# Patient Record
Sex: Female | Born: 1935 | Race: White | Hispanic: No | Smoking: Never smoker
Health system: Southern US, Community
[De-identification: ages and names within clinical notes are randomized; demographics above are authoritative.]

## PROBLEM LIST (undated history)

## (undated) DIAGNOSIS — E119 Type 2 diabetes mellitus without complications: Secondary | ICD-10-CM

## (undated) DIAGNOSIS — G629 Polyneuropathy, unspecified: Secondary | ICD-10-CM

## (undated) DIAGNOSIS — I10 Essential (primary) hypertension: Secondary | ICD-10-CM

## (undated) DIAGNOSIS — M1990 Unspecified osteoarthritis, unspecified site: Secondary | ICD-10-CM

## (undated) DIAGNOSIS — E7800 Pure hypercholesterolemia, unspecified: Secondary | ICD-10-CM

## (undated) HISTORY — DX: Unspecified osteoarthritis, unspecified site: M19.90

## (undated) HISTORY — DX: Essential (primary) hypertension: I10

## (undated) HISTORY — DX: Type 2 diabetes mellitus without complications: E11.9

## (undated) HISTORY — DX: Pure hypercholesterolemia, unspecified: E78.00

## (undated) HISTORY — DX: Polyneuropathy, unspecified: G62.9

---

## 1976-12-17 HISTORY — PX: CHOLECYSTECTOMY: SHX55

## 2002-05-14 ENCOUNTER — Other Ambulatory Visit: Admission: RE | Admit: 2002-05-14 | Discharge: 2002-05-14 | Payer: Self-pay | Admitting: Family Medicine

## 2003-08-30 ENCOUNTER — Observation Stay (HOSPITAL_COMMUNITY): Admission: EM | Admit: 2003-08-30 | Discharge: 2003-08-31 | Payer: Self-pay | Admitting: Emergency Medicine

## 2003-08-30 ENCOUNTER — Encounter: Payer: Self-pay | Admitting: Cardiovascular Disease

## 2003-09-24 ENCOUNTER — Encounter: Payer: Self-pay | Admitting: Family Medicine

## 2003-09-24 ENCOUNTER — Ambulatory Visit (HOSPITAL_COMMUNITY): Admission: RE | Admit: 2003-09-24 | Discharge: 2003-09-24 | Payer: Self-pay | Admitting: Family Medicine

## 2004-08-30 ENCOUNTER — Encounter: Admission: RE | Admit: 2004-08-30 | Discharge: 2004-10-02 | Payer: Self-pay | Admitting: Family Medicine

## 2004-09-08 ENCOUNTER — Ambulatory Visit (HOSPITAL_COMMUNITY): Admission: RE | Admit: 2004-09-08 | Discharge: 2004-09-08 | Payer: Self-pay | Admitting: Family Medicine

## 2008-09-01 ENCOUNTER — Ambulatory Visit: Payer: Self-pay | Admitting: Vascular Surgery

## 2009-04-18 ENCOUNTER — Encounter: Admission: RE | Admit: 2009-04-18 | Discharge: 2009-04-18 | Payer: Self-pay | Admitting: Obstetrics and Gynecology

## 2011-05-01 NOTE — Procedures (Signed)
DUPLEX DEEP VENOUS EXAM - LOWER EXTREMITY   INDICATION:  Bilateral lower extremity pain and swelling.   HISTORY:  Edema:  Bilateral for several months in the thigh.  Trauma/Surgery:  No.  Pain:  Bilateral.  PE:  No.  Previous DVT:  No.  Anticoagulants:  No.  Other:   DUPLEX EXAM:                CFV   SFV   PopV  PTV    GSV                R  L  R  L  R  L  R   L  R  L  Thrombosis    o  o  o  o  o  o  o   o  o  o  Spontaneous   +  +  +  +  +  +  +   +  +  +  Phasic        +  +  +  +  +  +  +   +  +  +  Augmentation  +  +  +  +  +  +  +   +  +  +  Compressible  +  +  +  +  +  +  +   +  +  +  Competent     +  +  +  +  +  +  +   +  +  +   Legend:  + - yes  o - no  p - partial  D - decreased   IMPRESSION:  No evidence of deep venous thrombosis or superficial venous  thrombosis bilaterally.   Dr. Hyacinth Meeker was notified via phone.    _____________________________  Larina Earthly, M.D.   AS/MEDQ  D:  09/01/2008  T:  09/01/2008  Job:  [id number]

## 2011-05-04 NOTE — H&P (Signed)
NAME:  Yolanda Castaneda, Yolanda Castaneda NO.:  [id number]   MEDICAL RECORD NO.:  [id number]                   PATIENT TYPE:  EMS   LOCATION:  MAJO                                 FACILITY:  [facility]   PHYSICIAN:  Vesta Mixer, M.D.              DATE OF BIRTH:  08/03/1935   DATE OF ADMISSION:  08/30/2003  DATE OF DISCHARGE:                                HISTORY & PHYSICAL   HISTORY OF PRESENT ILLNESS:  Yolanda Castaneda is a 76 year old female who was  admitted to the hospital for episodes of chest pain.   Yolanda Castaneda is a middle-aged female with a history of diabetes mellitus and  hypertension.  She has been in her normal health until yesterday when she  started feeling somewhat sick.  It was hard for her to describe how she was  feeling sick.  She just states that she has not been feeling well.  This  morning around 1 a.m., she developed substernal chest pain.  The chest pain  was described as a tightness and radiated to her right shoulder and right  arm.  She denied any diaphoresis, nausea, vomiting, syncope, or presyncope.  She was seen by EMS and the pain was relieved with sublingual nitroglycerin.  She was brought to the emergency room for further evaluation.   She is currently pain-free.   CURRENT MEDICATIONS:  1. Prandin 4 mg a day.  2. Glucophage two a day.  3. Accupril 20 mg a day.  4. Actos 45 mg a day.   ALLERGIES:  None.   PAST MEDICAL HISTORY:  1. Hypertension.  2. Diabetes mellitus.   SOCIAL HISTORY:  The patient does not smoke and does not drink alcohol.   FAMILY HISTORY:  Noncontributory.   REVIEW OF SYSTEMS:  She denies any heat or cold intolerance, weight gain, or  weight loss.  She denies any problems with her eyes, ears, nose, and throat.  She denies any cough or sputum production.  She denies any fever.  She  denies any hematuria, dysuria, or problems with her GU tract.  She denies  any blood in her stool.  She does have chronic diarrhea.   She denies any  aches, pains, or muscle problems.  She denies any rash or skin nodules.  She  denies any syncope, dizziness, numbness, or chronic pain.   PHYSICAL EXAMINATION:  GENERAL APPEARANCE:  She is a middle-aged female in  no acute distress.  She is alert and oriented x 3.  Her mood and affect are  normal.  VITAL SIGNS:  Her heart rate is 80, blood pressure 113/34, and respiratory  rate 22.  NECK:  2+ carotids.  She has no bruits.  There is no JVD.  No thyromegaly.  LUNGS:  Clear to auscultation.  HEART:  Regular rate.  S1 and S2.  No murmurs, rubs, or gallops.  ABDOMEN:  Good bowel sounds.  Nontender.  EXTREMITIES:  She has no cyanosis, clubbing, or edema.  NEUROLOGIC:  Nonfocal.   LABORATORY DATA:  Her EKG reveals normal sinus rhythm.  She has no ST-T wave  changes.  The EKG is essentially normal.   Yolanda Castaneda presents with an episode of chest pain.  The pain lasted for four  hours and was finally relieved with sublingual nitroglycerin.  Her current  set of enzymes is negative.  The remaining laboratory work is unremarkable.  At this point I would favor admitting her for observation.  If all of her  enzymes are negative, then we can discharge her to home, feeling comfortable  that she is at low risk for having an acute coronary event.  We will do a  stress Cardiolite study on her as an outpatient.  Her other medical problems  are stable.                                                Vesta Mixer, M.D.    PJN/MEDQ  D:  08/30/2003  T:  08/30/2003  Job:  [id number]   cc:   C. Duane Lope, M.D.  [address]  [place]  [place] [address]  Fax: [phone]

## 2011-05-04 NOTE — Discharge Summary (Signed)
   Yolanda Castaneda, Yolanda Castaneda NO.:  [id number]   MEDICAL RECORD NO.:  [id number]                   PATIENT TYPE:  OBV   LOCATION:  3741                                 FACILITY:  [facility]   PHYSICIAN:  Vesta Mixer, M.D.              DATE OF BIRTH:  09/25/1935   DATE OF ADMISSION:  08/30/2003  DATE OF DISCHARGE:  08/31/2003                                 DISCHARGE SUMMARY   DISCHARGE DIAGNOSES:  1. Chest pain, noncardiac.  2. Hypertension.  3. Diabetes mellitus.  4. Mild obesity.   DISCHARGE MEDICATIONS:  1. Accupril 20 mg a day.  2. Prandin 4 mg as directed by Dr. Tenny Craw.  3. Actos 45 mg a day.  4. Glucophage as directed by Dr. Tenny Craw (between 500 mg and 1 g a day).   DISPOSITION:  The patient will see Dr. Elease Hashimoto next week for a stress  Cardiolite study.  She is to call for an appointment.  She is to call if she  has any recurrent episodes of chest pain.   HISTORY OF PRESENT ILLNESS:  Ms. Risenhoover is a 76 year old female with a  history of diabetes mellitus and hypertension.  She was admitted last night  with an episode of chest pain.  Please see the dictated H&P for further  details.   HOSPITAL COURSE:  CHEST PAIN:  The patient ruled out for myocardial  infarction.  She had some chest pain early in the morning after her  admission, but it was clearly musculoskeletal.  It seemed to be worsened by  change of position and got better with Tylenol.  The patient's EKG remained  unchanged and her enzymes remained unchanged.  She will be discharged today  in satisfactory condition.  We will do a stress Cardiolite study as an  outpatient.  All of her other medical problems are stable.                                                Vesta Mixer, M.D.    PJN/MEDQ  D:  08/31/2003  T:  08/31/2003  Job:  [id number]   cc:   C. Duane Lope, M.D.  [address]  [place]  [place] [address]  Fax: [phone]

## 2012-01-03 DIAGNOSIS — E1149 Type 2 diabetes mellitus with other diabetic neurological complication: Secondary | ICD-10-CM | POA: Diagnosis not present

## 2012-01-03 DIAGNOSIS — G479 Sleep disorder, unspecified: Secondary | ICD-10-CM | POA: Diagnosis not present

## 2012-01-03 DIAGNOSIS — L84 Corns and callosities: Secondary | ICD-10-CM | POA: Diagnosis not present

## 2012-01-24 DIAGNOSIS — Q828 Other specified congenital malformations of skin: Secondary | ICD-10-CM | POA: Diagnosis not present

## 2012-01-24 DIAGNOSIS — M216X9 Other acquired deformities of unspecified foot: Secondary | ICD-10-CM | POA: Diagnosis not present

## 2012-01-24 DIAGNOSIS — L84 Corns and callosities: Secondary | ICD-10-CM | POA: Diagnosis not present

## 2012-04-11 DIAGNOSIS — E119 Type 2 diabetes mellitus without complications: Secondary | ICD-10-CM | POA: Diagnosis not present

## 2012-04-29 DIAGNOSIS — R252 Cramp and spasm: Secondary | ICD-10-CM | POA: Diagnosis not present

## 2012-05-02 DIAGNOSIS — M2560 Stiffness of unspecified joint, not elsewhere classified: Secondary | ICD-10-CM | POA: Diagnosis not present

## 2012-05-02 DIAGNOSIS — M62838 Other muscle spasm: Secondary | ICD-10-CM | POA: Diagnosis not present

## 2012-05-02 DIAGNOSIS — M545 Low back pain: Secondary | ICD-10-CM | POA: Diagnosis not present

## 2012-05-05 DIAGNOSIS — M545 Low back pain: Secondary | ICD-10-CM | POA: Diagnosis not present

## 2012-05-05 DIAGNOSIS — M2560 Stiffness of unspecified joint, not elsewhere classified: Secondary | ICD-10-CM | POA: Diagnosis not present

## 2012-05-05 DIAGNOSIS — M62838 Other muscle spasm: Secondary | ICD-10-CM | POA: Diagnosis not present

## 2012-05-07 DIAGNOSIS — M62838 Other muscle spasm: Secondary | ICD-10-CM | POA: Diagnosis not present

## 2012-05-07 DIAGNOSIS — M2560 Stiffness of unspecified joint, not elsewhere classified: Secondary | ICD-10-CM | POA: Diagnosis not present

## 2012-05-07 DIAGNOSIS — M545 Low back pain: Secondary | ICD-10-CM | POA: Diagnosis not present

## 2012-05-08 DIAGNOSIS — H6120 Impacted cerumen, unspecified ear: Secondary | ICD-10-CM | POA: Diagnosis not present

## 2012-05-08 DIAGNOSIS — H9209 Otalgia, unspecified ear: Secondary | ICD-10-CM | POA: Diagnosis not present

## 2012-05-09 DIAGNOSIS — M62838 Other muscle spasm: Secondary | ICD-10-CM | POA: Diagnosis not present

## 2012-05-09 DIAGNOSIS — M2560 Stiffness of unspecified joint, not elsewhere classified: Secondary | ICD-10-CM | POA: Diagnosis not present

## 2012-05-09 DIAGNOSIS — M545 Low back pain: Secondary | ICD-10-CM | POA: Diagnosis not present

## 2012-07-03 DIAGNOSIS — E119 Type 2 diabetes mellitus without complications: Secondary | ICD-10-CM | POA: Diagnosis not present

## 2012-07-03 DIAGNOSIS — M25569 Pain in unspecified knee: Secondary | ICD-10-CM | POA: Diagnosis not present

## 2012-07-03 DIAGNOSIS — E7800 Pure hypercholesterolemia, unspecified: Secondary | ICD-10-CM | POA: Diagnosis not present

## 2012-10-30 DIAGNOSIS — E119 Type 2 diabetes mellitus without complications: Secondary | ICD-10-CM | POA: Diagnosis not present

## 2012-11-05 DIAGNOSIS — Z23 Encounter for immunization: Secondary | ICD-10-CM | POA: Diagnosis not present

## 2012-11-19 DIAGNOSIS — E7800 Pure hypercholesterolemia, unspecified: Secondary | ICD-10-CM | POA: Diagnosis not present

## 2012-11-19 DIAGNOSIS — I10 Essential (primary) hypertension: Secondary | ICD-10-CM | POA: Diagnosis not present

## 2012-11-19 DIAGNOSIS — IMO0001 Reserved for inherently not codable concepts without codable children: Secondary | ICD-10-CM | POA: Diagnosis not present

## 2012-11-19 DIAGNOSIS — K219 Gastro-esophageal reflux disease without esophagitis: Secondary | ICD-10-CM | POA: Diagnosis not present

## 2012-11-25 DIAGNOSIS — T169XXA Foreign body in ear, unspecified ear, initial encounter: Secondary | ICD-10-CM | POA: Diagnosis not present

## 2012-12-02 DIAGNOSIS — T169XXA Foreign body in ear, unspecified ear, initial encounter: Secondary | ICD-10-CM | POA: Diagnosis not present

## 2012-12-19 DIAGNOSIS — N390 Urinary tract infection, site not specified: Secondary | ICD-10-CM | POA: Diagnosis not present

## 2012-12-19 DIAGNOSIS — I10 Essential (primary) hypertension: Secondary | ICD-10-CM | POA: Diagnosis not present

## 2013-02-13 DIAGNOSIS — R05 Cough: Secondary | ICD-10-CM | POA: Diagnosis not present

## 2013-02-13 DIAGNOSIS — R059 Cough, unspecified: Secondary | ICD-10-CM | POA: Diagnosis not present

## 2013-02-26 DIAGNOSIS — M549 Dorsalgia, unspecified: Secondary | ICD-10-CM | POA: Diagnosis not present

## 2013-02-26 DIAGNOSIS — N390 Urinary tract infection, site not specified: Secondary | ICD-10-CM | POA: Diagnosis not present

## 2013-02-26 DIAGNOSIS — IMO0001 Reserved for inherently not codable concepts without codable children: Secondary | ICD-10-CM | POA: Diagnosis not present

## 2013-02-26 DIAGNOSIS — R599 Enlarged lymph nodes, unspecified: Secondary | ICD-10-CM | POA: Diagnosis not present

## 2013-02-26 DIAGNOSIS — E7800 Pure hypercholesterolemia, unspecified: Secondary | ICD-10-CM | POA: Diagnosis not present

## 2013-03-04 ENCOUNTER — Encounter (INDEPENDENT_AMBULATORY_CARE_PROVIDER_SITE_OTHER): Payer: Self-pay | Admitting: General Surgery

## 2013-03-05 ENCOUNTER — Encounter (INDEPENDENT_AMBULATORY_CARE_PROVIDER_SITE_OTHER): Payer: Self-pay | Admitting: General Surgery

## 2013-03-05 ENCOUNTER — Ambulatory Visit (INDEPENDENT_AMBULATORY_CARE_PROVIDER_SITE_OTHER): Payer: Medicare Other | Admitting: General Surgery

## 2013-03-05 ENCOUNTER — Telehealth (INDEPENDENT_AMBULATORY_CARE_PROVIDER_SITE_OTHER): Payer: Self-pay | Admitting: General Surgery

## 2013-03-05 VITALS — BP 118/80 | HR 84 | Resp 18 | Ht 62.0 in | Wt 219.0 lb

## 2013-03-05 DIAGNOSIS — R221 Localized swelling, mass and lump, neck: Secondary | ICD-10-CM

## 2013-03-05 DIAGNOSIS — R220 Localized swelling, mass and lump, head: Secondary | ICD-10-CM

## 2013-03-05 NOTE — Telephone Encounter (Signed)
Spoke with patient she is aware of appt on 03/06/13 at 2:30 pm

## 2013-03-05 NOTE — Patient Instructions (Signed)
I believe the lump is a lipoma.  We will get an ultrasound to confirm diagnosis  Lipoma A lipoma is a noncancerous (benign) tumor composed of fat cells. They are usually found under the skin (subcutaneous). A lipoma may occur in any tissue of the body that contains fat. Common areas for lipomas to appear include the back, shoulders, buttocks, and thighs. Lipomas are a very common soft tissue growth. They are soft and grow slowly. Most problems caused by a lipoma depend on where it is growing. DIAGNOSIS  A lipoma can be diagnosed with a physical exam. These tumors rarely become cancerous, but radiographic studies can help determine this for certain. Studies used may include:  Computerized X-ray scans (CT or CAT scan).  Computerized magnetic scans (MRI). TREATMENT  Small lipomas that are not causing problems may be watched. If a lipoma continues to enlarge or causes problems, removal is often the best treatment. Lipomas can also be removed to improve appearance. Surgery is done to remove the fatty cells and the surrounding capsule. Most often, this is done with medicine that numbs the area (local anesthetic). The removed tissue is examined under a microscope to make sure it is not cancerous. Keep all follow-up appointments with your caregiver. SEEK MEDICAL CARE IF:   The lipoma becomes larger or hard.  The lipoma becomes painful, red, or increasingly swollen. These could be signs of infection or a more serious condition. Document Released: 11/23/2002 Document Revised: 02/25/2012 Document Reviewed: 05/05/2010 [facility] Patient Information 2013 [place], [place].

## 2013-03-05 NOTE — Progress Notes (Signed)
Patient ID: Yolanda Castaneda, female   DOB: Oct 27, 1935, 78 y.o.   MRN: [id number]  Chief Complaint  Patient presents with  . Lymphadenopathy    HPI Yolanda Castaneda is a 78 y.o. female.   HPI 78 yo WF referred by Dr C. Duane Lope for evaluation of neck lymphadenopathy. The patient states that she was found to have a soft tissue mass of her right neck a few years ago and was told it was a probable lipoma. She states that over the past couple years she thinks that it has slowly gotten larger. She denies any skin changes to this area. She denies any trauma to the area. She denies any prior radiation. It is not tender. She states that most recently a second lesion was noticed on the right side of her neck. She states that she's been struggling with short of an upper respiratory tract infection for about a month and a half. She denies any fever, chills, night sweats. She denies any weight loss. She states that her voice is normal. However her daughter states that she thinks her mother's voice is a little but more raspy than normal. She has been intermittently on and off antibiotics for the upper respiratory tract infection. She denies any use of tobacco or alcohol. She also complains of some sensation of food getting stuck. She does not have any pain when she swallows. Past Medical History  Diagnosis Date  . Hypercholesteremia   . Hypertension   . Diabetes mellitus   . OA (osteoarthritis)   . 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 Cholecystectomy  1978  . Cesarean section  1961    Family History  Problem Relation Age of Onset  . Transient ischemic attack Father   . Ovarian cancer Mother   . Prostate cancer Brother     Social History History  Substance Use Topics  . Smoking status: Never Smoker   . Smokeless tobacco: Not on file  . Alcohol Use: No    Allergies  Allergen Reactions  . Atorvastatin     Muscle aches  .  Macrobid (Nitrofurantoin Macrocrystal)   . Niaspan (Niacin Er)   . Victoza (Liraglutide) Hives and Nausea And Vomiting  . Penicillins Rash    Current Outpatient Prescriptions  Medication Sig Dispense Refill  . acetaminophen (TYLENOL) 325 MG tablet Take 650 mg by mouth every 6 (six) hours as needed for pain.      Marland Kitchen albuterol (PROVENTIL) (2.5 MG/3ML) 0.083% nebulizer solution Take 2.5 mg by nebulization every 6 (six) hours as needed for wheezing.      Marland Kitchen aspirin 81 MG tablet Take 81 mg by mouth daily.      . cyclobenzaprine (FLEXERIL) 10 MG tablet Take 10 mg by mouth 3 (three) times daily as needed for muscle spasms.      . Doxylamine Succinate, Sleep, (SLEEP AID PO) Take by mouth as needed.      . Homeopathic Products ([employer] LEG CRAMPS PAIN RELIEF PO) Take by mouth as needed.      . Magnesium 200 MG TABS Take 1 tablet by mouth daily.      . metFORMIN (GLUCOPHAGE) 850 MG tablet Take 850 mg by mouth 2 (two) times daily with a meal.      . Multiple Vitamins-Minerals (CENTRUM SILVER PO) Take 1 tablet by mouth daily.      . naproxen sodium (ANAPROX) 220 MG tablet Take 220  mg by mouth 2 (two) times daily with a meal.      . Omega-3 Fatty Acids (FISH OIL) 1000 MG CAPS Take 1 capsule by mouth daily.      . pioglitazone (ACTOS) 45 MG tablet Take 45 mg by mouth daily.      . Potassium 95 MG TABS Take 1 tablet by mouth daily.      . promethazine-dextromethorphan (PROMETHAZINE-DM) 6.25-15 MG/5ML syrup Take by mouth 4 (four) times daily as needed for cough.      . quinapril (ACCUPRIL) 40 MG tablet Take 40 mg by mouth at bedtime.      . repaglinide (PRANDIN) 2 MG tablet Take 2 mg by mouth 3 (three) times daily before meals.      Marland Kitchen sulfamethoxazole-trimethoprim (BACTRIM DS,SEPTRA DS) 800-160 MG per tablet Take 1 tablet by mouth 2 (two) times daily.       No current facility-administered medications for this visit.    Review of Systems Review of Systems  Constitutional: Negative for fever, chills and  unexpected weight change.  HENT: Positive for hearing loss and trouble swallowing. Negative for congestion.   Eyes: Negative for visual disturbance.  Respiratory: Negative for cough and wheezing.   Cardiovascular: Negative for chest pain, palpitations and leg swelling.  Gastrointestinal: Negative for nausea, vomiting, abdominal pain, diarrhea, constipation, blood in stool, abdominal distention and anal bleeding.  Genitourinary: Negative for hematuria, vaginal bleeding and difficulty urinating.  Musculoskeletal: Negative for arthralgias.       +jt pain  Skin: Negative for rash and wound.  Neurological: Negative for seizures, syncope and headaches.  Hematological: Positive for adenopathy. Does not bruise/bleed easily.  Psychiatric/Behavioral: Negative for confusion.    Blood pressure 118/80, pulse 84, resp. rate 18, height 5\' 2"  (1.575 m), weight 219 lb (99.338 kg).  Physical Exam Physical Exam  Vitals reviewed. Constitutional: She is oriented to person, place, and time. She appears well-developed and well-nourished. No distress.  HENT:  Head: Normocephalic and atraumatic.  Right Ear: External ear normal.  Left Ear: External ear normal.  Eyes: Conjunctivae are normal. No scleral icterus.  Neck: Normal range of motion and phonation normal. Neck supple. No tracheal deviation present. No thyromegaly present.    No thyromegaly. At level of thyroid cartilage on Rt neck about 4cm from midline, soft well circumscribed soft mass. No skin changes. Mobile. Nontender. About 1.5 x 1.5cm; no other palpable neck lesions/masses  Cardiovascular: Normal rate, regular rhythm and normal heart sounds.   Pulmonary/Chest: Effort normal and breath sounds normal. No stridor. No respiratory distress. She has no wheezes.  Abdominal: Soft. There is no tenderness.  Musculoskeletal: She exhibits no edema and no tenderness.  Lymphadenopathy:       Head (right side): No submental, no submandibular, no preauricular  and no posterior auricular adenopathy present.       Head (left side): No submental, no submandibular, no preauricular and no posterior auricular adenopathy present.    She has no cervical adenopathy.       Right: No supraclavicular adenopathy present.       Left: No supraclavicular adenopathy present.  Neurological: She is alert and oriented to person, place, and time.  Skin: Skin is warm and dry. No rash noted. She is not diaphoretic. No erythema. No pallor.  Psychiatric: She has a normal mood and affect. Her behavior is normal. Judgment and thought content normal.    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 The area that I can palpate is most consistent with a lipoma. I did find that she had a prior neck ultrasound from about 4 years ago that showed bilateral thyroid nodules. I explained that a second area could have been a temporarily enlarged lymph node due to her recent Upper respiratory tract infection.  Nonetheless I do think it's worthwhile to get a bilateral neck ultrasound to confirm a diagnosis of a lipoma as well as to make sure that there is no lymphadenopathy that is suspicious. Her and her daughter are in agreement with this plan. This will also allow [place] to reimage her thyroid to see if any of the nodules are large enough that warrants FNA.  Her followup will be based on the results of her neck ultrasound  Mary Sella. Andrey Campanile, MD, FACS General, Bariatric, & Minimally Invasive Surgery [facility], [place]        [facility] M 03/05/2013, 6:06 PM

## 2013-03-06 ENCOUNTER — Ambulatory Visit
Admission: RE | Admit: 2013-03-06 | Discharge: 2013-03-06 | Disposition: A | Discharge status: Home / Self Care | Payer: Medicare Other | Source: Ambulatory Visit | Attending: General Surgery | Admitting: General Surgery

## 2013-03-06 DIAGNOSIS — R221 Localized swelling, mass and lump, neck: Secondary | ICD-10-CM

## 2013-03-06 DIAGNOSIS — D1739 Benign lipomatous neoplasm of skin and subcutaneous tissue of other sites: Secondary | ICD-10-CM | POA: Diagnosis not present

## 2013-03-09 ENCOUNTER — Telehealth (INDEPENDENT_AMBULATORY_CARE_PROVIDER_SITE_OTHER): Payer: Self-pay | Admitting: General Surgery

## 2013-03-09 NOTE — Telephone Encounter (Signed)
Message copied by Liliana Cline on Mon Mar 09, 2013 10:25 AM ------      Message from: Andrey Campanile, ERIC M      Created: Mon Mar 09, 2013  9:42 AM       pls call pt - u/s confirms that the nodule/lump on right side is lipoma. No other abnormalities. Options are leave it alone (99.5% chance benign lipoma) or excise in ambulatory surgery center ------

## 2013-03-09 NOTE — Telephone Encounter (Signed)
Spoke with patient. Made her aware of ultrasound results. Patient states she will think about having surgery and call back, but she is leaning towards no surgery. She will also call with any questions.

## 2013-03-30 DIAGNOSIS — H2510 Age-related nuclear cataract, unspecified eye: Secondary | ICD-10-CM | POA: Diagnosis not present

## 2013-04-17 DIAGNOSIS — M25569 Pain in unspecified knee: Secondary | ICD-10-CM | POA: Diagnosis not present

## 2013-04-17 DIAGNOSIS — M6281 Muscle weakness (generalized): Secondary | ICD-10-CM | POA: Diagnosis not present

## 2013-04-17 DIAGNOSIS — R6889 Other general symptoms and signs: Secondary | ICD-10-CM | POA: Diagnosis not present

## 2013-04-17 DIAGNOSIS — M2560 Stiffness of unspecified joint, not elsewhere classified: Secondary | ICD-10-CM | POA: Diagnosis not present

## 2013-04-20 DIAGNOSIS — M25569 Pain in unspecified knee: Secondary | ICD-10-CM | POA: Diagnosis not present

## 2013-04-20 DIAGNOSIS — M6281 Muscle weakness (generalized): Secondary | ICD-10-CM | POA: Diagnosis not present

## 2013-04-20 DIAGNOSIS — R6889 Other general symptoms and signs: Secondary | ICD-10-CM | POA: Diagnosis not present

## 2013-04-20 DIAGNOSIS — M2560 Stiffness of unspecified joint, not elsewhere classified: Secondary | ICD-10-CM | POA: Diagnosis not present

## 2013-04-22 DIAGNOSIS — R609 Edema, unspecified: Secondary | ICD-10-CM | POA: Diagnosis not present

## 2013-04-22 DIAGNOSIS — M2560 Stiffness of unspecified joint, not elsewhere classified: Secondary | ICD-10-CM | POA: Diagnosis not present

## 2013-04-22 DIAGNOSIS — M25569 Pain in unspecified knee: Secondary | ICD-10-CM | POA: Diagnosis not present

## 2013-04-22 DIAGNOSIS — M6281 Muscle weakness (generalized): Secondary | ICD-10-CM | POA: Diagnosis not present

## 2013-04-22 DIAGNOSIS — I10 Essential (primary) hypertension: Secondary | ICD-10-CM | POA: Diagnosis not present

## 2013-04-22 DIAGNOSIS — IMO0001 Reserved for inherently not codable concepts without codable children: Secondary | ICD-10-CM | POA: Diagnosis not present

## 2013-04-22 DIAGNOSIS — R6889 Other general symptoms and signs: Secondary | ICD-10-CM | POA: Diagnosis not present

## 2013-04-27 DIAGNOSIS — M25569 Pain in unspecified knee: Secondary | ICD-10-CM | POA: Diagnosis not present

## 2013-04-27 DIAGNOSIS — M6281 Muscle weakness (generalized): Secondary | ICD-10-CM | POA: Diagnosis not present

## 2013-04-27 DIAGNOSIS — R6889 Other general symptoms and signs: Secondary | ICD-10-CM | POA: Diagnosis not present

## 2013-04-27 DIAGNOSIS — M2560 Stiffness of unspecified joint, not elsewhere classified: Secondary | ICD-10-CM | POA: Diagnosis not present

## 2013-04-29 DIAGNOSIS — IMO0001 Reserved for inherently not codable concepts without codable children: Secondary | ICD-10-CM | POA: Diagnosis not present

## 2013-04-29 DIAGNOSIS — R609 Edema, unspecified: Secondary | ICD-10-CM | POA: Diagnosis not present

## 2013-05-01 DIAGNOSIS — M25569 Pain in unspecified knee: Secondary | ICD-10-CM | POA: Diagnosis not present

## 2013-05-01 DIAGNOSIS — M6281 Muscle weakness (generalized): Secondary | ICD-10-CM | POA: Diagnosis not present

## 2013-05-01 DIAGNOSIS — M2560 Stiffness of unspecified joint, not elsewhere classified: Secondary | ICD-10-CM | POA: Diagnosis not present

## 2013-05-01 DIAGNOSIS — R6889 Other general symptoms and signs: Secondary | ICD-10-CM | POA: Diagnosis not present

## 2013-05-04 DIAGNOSIS — M2560 Stiffness of unspecified joint, not elsewhere classified: Secondary | ICD-10-CM | POA: Diagnosis not present

## 2013-05-04 DIAGNOSIS — M6281 Muscle weakness (generalized): Secondary | ICD-10-CM | POA: Diagnosis not present

## 2013-05-04 DIAGNOSIS — R6889 Other general symptoms and signs: Secondary | ICD-10-CM | POA: Diagnosis not present

## 2013-05-04 DIAGNOSIS — M25569 Pain in unspecified knee: Secondary | ICD-10-CM | POA: Diagnosis not present

## 2013-05-07 DIAGNOSIS — R609 Edema, unspecified: Secondary | ICD-10-CM | POA: Diagnosis not present

## 2013-05-07 DIAGNOSIS — M25569 Pain in unspecified knee: Secondary | ICD-10-CM | POA: Diagnosis not present

## 2013-05-07 DIAGNOSIS — M6281 Muscle weakness (generalized): Secondary | ICD-10-CM | POA: Diagnosis not present

## 2013-05-07 DIAGNOSIS — M2560 Stiffness of unspecified joint, not elsewhere classified: Secondary | ICD-10-CM | POA: Diagnosis not present

## 2013-05-07 DIAGNOSIS — R252 Cramp and spasm: Secondary | ICD-10-CM | POA: Diagnosis not present

## 2013-05-07 DIAGNOSIS — R6889 Other general symptoms and signs: Secondary | ICD-10-CM | POA: Diagnosis not present

## 2013-05-15 DIAGNOSIS — R030 Elevated blood-pressure reading, without diagnosis of hypertension: Secondary | ICD-10-CM | POA: Diagnosis not present

## 2013-05-15 DIAGNOSIS — R609 Edema, unspecified: Secondary | ICD-10-CM | POA: Diagnosis not present

## 2013-05-18 DIAGNOSIS — M5137 Other intervertebral disc degeneration, lumbosacral region: Secondary | ICD-10-CM | POA: Diagnosis not present

## 2013-05-18 DIAGNOSIS — M999 Biomechanical lesion, unspecified: Secondary | ICD-10-CM | POA: Diagnosis not present

## 2013-05-20 DIAGNOSIS — M5137 Other intervertebral disc degeneration, lumbosacral region: Secondary | ICD-10-CM | POA: Diagnosis not present

## 2013-05-20 DIAGNOSIS — M999 Biomechanical lesion, unspecified: Secondary | ICD-10-CM | POA: Diagnosis not present

## 2013-05-22 DIAGNOSIS — R5381 Other malaise: Secondary | ICD-10-CM | POA: Diagnosis not present

## 2013-05-22 DIAGNOSIS — M999 Biomechanical lesion, unspecified: Secondary | ICD-10-CM | POA: Diagnosis not present

## 2013-05-22 DIAGNOSIS — M5137 Other intervertebral disc degeneration, lumbosacral region: Secondary | ICD-10-CM | POA: Diagnosis not present

## 2013-05-22 DIAGNOSIS — G479 Sleep disorder, unspecified: Secondary | ICD-10-CM | POA: Diagnosis not present

## 2013-05-22 DIAGNOSIS — R5383 Other fatigue: Secondary | ICD-10-CM | POA: Diagnosis not present

## 2013-05-22 DIAGNOSIS — I959 Hypotension, unspecified: Secondary | ICD-10-CM | POA: Diagnosis not present

## 2013-05-25 DIAGNOSIS — M5137 Other intervertebral disc degeneration, lumbosacral region: Secondary | ICD-10-CM | POA: Diagnosis not present

## 2013-05-25 DIAGNOSIS — M999 Biomechanical lesion, unspecified: Secondary | ICD-10-CM | POA: Diagnosis not present

## 2013-05-27 DIAGNOSIS — M999 Biomechanical lesion, unspecified: Secondary | ICD-10-CM | POA: Diagnosis not present

## 2013-05-27 DIAGNOSIS — M5137 Other intervertebral disc degeneration, lumbosacral region: Secondary | ICD-10-CM | POA: Diagnosis not present

## 2013-05-29 DIAGNOSIS — M999 Biomechanical lesion, unspecified: Secondary | ICD-10-CM | POA: Diagnosis not present

## 2013-05-29 DIAGNOSIS — M5137 Other intervertebral disc degeneration, lumbosacral region: Secondary | ICD-10-CM | POA: Diagnosis not present

## 2013-06-10 DIAGNOSIS — R3915 Urgency of urination: Secondary | ICD-10-CM | POA: Diagnosis not present

## 2013-06-10 DIAGNOSIS — E7800 Pure hypercholesterolemia, unspecified: Secondary | ICD-10-CM | POA: Diagnosis not present

## 2013-06-10 DIAGNOSIS — K219 Gastro-esophageal reflux disease without esophagitis: Secondary | ICD-10-CM | POA: Diagnosis not present

## 2013-06-10 DIAGNOSIS — E039 Hypothyroidism, unspecified: Secondary | ICD-10-CM | POA: Diagnosis not present

## 2013-06-10 DIAGNOSIS — E1142 Type 2 diabetes mellitus with diabetic polyneuropathy: Secondary | ICD-10-CM | POA: Diagnosis not present

## 2013-06-10 DIAGNOSIS — Z23 Encounter for immunization: Secondary | ICD-10-CM | POA: Diagnosis not present

## 2013-06-10 DIAGNOSIS — Z0000 Encounter for general adult medical examination without abnormal findings: Secondary | ICD-10-CM | POA: Diagnosis not present

## 2013-06-10 DIAGNOSIS — E1149 Type 2 diabetes mellitus with other diabetic neurological complication: Secondary | ICD-10-CM | POA: Diagnosis not present

## 2013-06-10 DIAGNOSIS — I10 Essential (primary) hypertension: Secondary | ICD-10-CM | POA: Diagnosis not present

## 2013-06-12 DIAGNOSIS — K219 Gastro-esophageal reflux disease without esophagitis: Secondary | ICD-10-CM | POA: Diagnosis not present

## 2013-06-12 DIAGNOSIS — E7800 Pure hypercholesterolemia, unspecified: Secondary | ICD-10-CM | POA: Diagnosis not present

## 2013-06-12 DIAGNOSIS — R3915 Urgency of urination: Secondary | ICD-10-CM | POA: Diagnosis not present

## 2013-06-12 DIAGNOSIS — E1142 Type 2 diabetes mellitus with diabetic polyneuropathy: Secondary | ICD-10-CM | POA: Diagnosis not present

## 2013-06-12 DIAGNOSIS — I10 Essential (primary) hypertension: Secondary | ICD-10-CM | POA: Diagnosis not present

## 2013-06-12 DIAGNOSIS — E039 Hypothyroidism, unspecified: Secondary | ICD-10-CM | POA: Diagnosis not present

## 2013-06-12 DIAGNOSIS — E1149 Type 2 diabetes mellitus with other diabetic neurological complication: Secondary | ICD-10-CM | POA: Diagnosis not present

## 2013-06-12 DIAGNOSIS — Z0000 Encounter for general adult medical examination without abnormal findings: Secondary | ICD-10-CM | POA: Diagnosis not present

## 2013-07-02 DIAGNOSIS — F411 Generalized anxiety disorder: Secondary | ICD-10-CM | POA: Diagnosis not present

## 2013-07-02 DIAGNOSIS — J40 Bronchitis, not specified as acute or chronic: Secondary | ICD-10-CM | POA: Diagnosis not present

## 2013-07-02 DIAGNOSIS — H3320 Serous retinal detachment, unspecified eye: Secondary | ICD-10-CM | POA: Diagnosis not present

## 2013-07-02 DIAGNOSIS — Z48812 Encounter for surgical aftercare following surgery on the circulatory system: Secondary | ICD-10-CM | POA: Diagnosis not present

## 2013-08-21 DIAGNOSIS — H8110 Benign paroxysmal vertigo, unspecified ear: Secondary | ICD-10-CM | POA: Diagnosis not present

## 2013-08-31 ENCOUNTER — Ambulatory Visit: Payer: Medicare Other | Admitting: Rehabilitative and Restorative Service Providers"

## 2013-09-01 ENCOUNTER — Ambulatory Visit: Payer: Medicare Other | Attending: Family Medicine | Admitting: Physical Therapy

## 2013-09-01 DIAGNOSIS — R42 Dizziness and giddiness: Secondary | ICD-10-CM | POA: Insufficient documentation

## 2013-09-01 DIAGNOSIS — IMO0001 Reserved for inherently not codable concepts without codable children: Secondary | ICD-10-CM | POA: Insufficient documentation

## 2013-09-01 DIAGNOSIS — R269 Unspecified abnormalities of gait and mobility: Secondary | ICD-10-CM | POA: Diagnosis not present

## 2013-09-10 DIAGNOSIS — H2510 Age-related nuclear cataract, unspecified eye: Secondary | ICD-10-CM | POA: Diagnosis not present

## 2013-09-10 DIAGNOSIS — E119 Type 2 diabetes mellitus without complications: Secondary | ICD-10-CM | POA: Diagnosis not present

## 2013-09-30 DIAGNOSIS — E119 Type 2 diabetes mellitus without complications: Secondary | ICD-10-CM | POA: Diagnosis not present

## 2013-09-30 DIAGNOSIS — R4789 Other speech disturbances: Secondary | ICD-10-CM | POA: Diagnosis not present

## 2013-09-30 DIAGNOSIS — I10 Essential (primary) hypertension: Secondary | ICD-10-CM | POA: Diagnosis not present

## 2013-09-30 DIAGNOSIS — H539 Unspecified visual disturbance: Secondary | ICD-10-CM | POA: Diagnosis not present

## 2013-09-30 DIAGNOSIS — E7800 Pure hypercholesterolemia, unspecified: Secondary | ICD-10-CM | POA: Diagnosis not present

## 2013-10-01 ENCOUNTER — Other Ambulatory Visit: Payer: Self-pay | Admitting: Family Medicine

## 2013-10-01 DIAGNOSIS — R479 Unspecified speech disturbances: Secondary | ICD-10-CM

## 2013-10-09 ENCOUNTER — Ambulatory Visit
Admission: RE | Admit: 2013-10-09 | Discharge: 2013-10-09 | Disposition: A | Discharge status: Home / Self Care | Payer: Medicare Other | Source: Ambulatory Visit | Attending: Family Medicine | Admitting: Family Medicine

## 2013-10-09 DIAGNOSIS — I6529 Occlusion and stenosis of unspecified carotid artery: Secondary | ICD-10-CM | POA: Diagnosis not present

## 2013-10-09 DIAGNOSIS — R479 Unspecified speech disturbances: Secondary | ICD-10-CM

## 2013-10-09 DIAGNOSIS — D320 Benign neoplasm of cerebral meninges: Secondary | ICD-10-CM | POA: Diagnosis not present

## 2013-10-15 ENCOUNTER — Other Ambulatory Visit: Payer: Self-pay

## 2013-10-15 ENCOUNTER — Other Ambulatory Visit (HOSPITAL_COMMUNITY): Payer: Self-pay | Admitting: Family Medicine

## 2013-10-15 ENCOUNTER — Ambulatory Visit (HOSPITAL_COMMUNITY): Payer: Medicare Other | Attending: Family Medicine | Admitting: Radiology

## 2013-10-15 DIAGNOSIS — G459 Transient cerebral ischemic attack, unspecified: Secondary | ICD-10-CM

## 2013-10-15 DIAGNOSIS — I69928 Other speech and language deficits following unspecified cerebrovascular disease: Secondary | ICD-10-CM | POA: Diagnosis not present

## 2013-10-15 DIAGNOSIS — R479 Unspecified speech disturbances: Secondary | ICD-10-CM

## 2013-10-15 NOTE — Progress Notes (Signed)
Echocardiogram performed.  

## 2013-10-20 ENCOUNTER — Encounter (INDEPENDENT_AMBULATORY_CARE_PROVIDER_SITE_OTHER): Payer: Self-pay

## 2013-10-20 ENCOUNTER — Ambulatory Visit (INDEPENDENT_AMBULATORY_CARE_PROVIDER_SITE_OTHER): Payer: Medicare Other | Admitting: Neurology

## 2013-10-20 ENCOUNTER — Encounter: Payer: Self-pay | Admitting: Neurology

## 2013-10-20 VITALS — BP 143/60 | HR 70 | Ht 61.0 in | Wt 246.0 lb

## 2013-10-20 DIAGNOSIS — R4182 Altered mental status, unspecified: Secondary | ICD-10-CM

## 2013-10-20 DIAGNOSIS — D320 Benign neoplasm of cerebral meninges: Secondary | ICD-10-CM | POA: Diagnosis not present

## 2013-10-20 DIAGNOSIS — D329 Benign neoplasm of meninges, unspecified: Secondary | ICD-10-CM | POA: Insufficient documentation

## 2013-10-20 NOTE — Patient Instructions (Signed)
Overall you are doing fairly well but I do want to suggest a few things today:   Remember to drink plenty of fluid, eat healthy meals and do not skip any meals. Try to eat protein with a every meal and eat a healthy snack such as fruit or nuts in between meals. Try to keep a regular sleep-wake schedule and try to exercise daily, particularly in the form of walking, 20-30 minutes a day, if you can.   Your symptoms are concerning for a possible Transient Ischemic attack (min-stroke) or a possible seizure due to the meningioma. In most cases the meningiomas are benign and just need to be monitored.   As far as your medications are concerned, I would like to suggest continuing on the aspirin 325mg  daily and Pravachol.   As far as diagnostic testing:  1)EEG 2)We will repeat your brain MRI in 1 year  I would like to see you back in 12 months, sooner if we need to. Please call us with any interim questions, concerns, problems, updates or refill requests.   Please also call us for any test results so we can go over those with you on the phone.  My clinical assistant and will answer any of your questions and relay your messages to me and also relay most of my messages to you.   Our phone number is [phone]. We also have an after hours call service for urgent matters and there is a physician on-call for urgent questions. For any emergencies you know to call 911 or go to the nearest emergency room

## 2013-10-20 NOTE — Progress Notes (Signed)
[facility]    Provider:  Dr Hosie Poisson Referring Provider: Duane Lope, MD Primary Care Physician:   Duane Lope, MD\  CC: abnormal brain mri  HPI:  Yolanda Castaneda is a 78 y.o. female here as a referral from Dr. Tenny Craw for abnormal brain MRI  Had 2 brief episodes of word finding difficulties, she was aware of it/her surroundings the whole time, no automatisms during the event, patient reports it was awhile since she had ate, raised concern that her sugards were low but did not eat anything and it went away on its own. No noted light headed sensation, both episodes of <30s. For years have seen geometric patterns of flashing lights, comes and goes, lasts around 1 minute and then resolves. In July had brief episode of vertigo lasting <5 minutes. No history of prior strokes or mini strokes. No migraine headaches. No seizure history.   Had stroke workup done by PCP including unremarkable carotid ultrasound, MRI brain.   Reviewed notes, labs and imaging from outside physician, pertinent for prior the brain showing chronic small vessel disease, possible old infarct and a  7 mm meningioma in the left paracentral region.   Review of Systems: Out of a complete 14 system review, the patient complains of only the following symptoms, and all other reviewed systems are negative. Positive for chills weight gain eye pain easy bruising restart numbness tremor wheezing swelling in legs hair loss ringing in ears cramps allergies to much sleep  History   Social History  . Marital Status: Widowed    Spouse Name: N/A    Number of Children: 2  . Years of Education: BS   Occupational History  . Not on file.   Social History Main Topics  . Smoking status: Never Smoker   . Smokeless tobacco: Never Used  . Alcohol Use: No  . Drug Use: No  . Sexual Activity: Not on file   Other Topics Concern  . Not on file   Social History Narrative   Patient lives at home alone.    Patient is retired.     Patient is widowed.    Patient has 2 daughters.    Patient has her BS    Family History  Problem Relation Age of Onset  . Transient ischemic attack Father   . Ovarian cancer Mother   . Prostate cancer Brother     Past Medical History  Diagnosis Date  . Hypercholesteremia   . Hypertension   . Diabetes mellitus   . OA (osteoarthritis)   . 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 Cholecystectomy  1978  . Cesarean section  1961    Current Outpatient Prescriptions  Medication Sig Dispense Refill  . acetaminophen (TYLENOL) 325 MG tablet Take 650 mg by mouth every 6 (six) hours as needed for pain.      Marland Kitchen albuterol (PROVENTIL) (2.5 MG/3ML) 0.083% nebulizer solution Take 2.5 mg by nebulization every 6 (six) hours as needed for wheezing.      Marland Kitchen aspirin 81 MG tablet Take 81 mg by mouth daily.      . cyclobenzaprine (FLEXERIL) 10 MG tablet Take 10 mg by mouth 3 (three) times daily as needed for muscle spasms.      . Doxylamine Succinate, Sleep, (SLEEP AID PO) Take by mouth as needed.      . Homeopathic Products ([employer] LEG CRAMPS PAIN RELIEF PO) Take by mouth as  needed.      . Magnesium 200 MG TABS Take 1 tablet by mouth daily.      . Multiple Vitamins-Minerals (CENTRUM SILVER PO) Take 1 tablet by mouth daily.      . naproxen sodium (ANAPROX) 220 MG tablet Take 220 mg by mouth 2 (two) times daily with a meal.      . Omega-3 Fatty Acids (FISH OIL) 1000 MG CAPS Take 1 capsule by mouth daily.      . pioglitazone (ACTOS) 45 MG tablet Take 45 mg by mouth daily.      . Potassium 95 MG TABS Take 1 tablet by mouth daily.      . pravastatin (PRAVACHOL) 80 MG tablet 80 mg.      . promethazine-dextromethorphan (PROMETHAZINE-DM) 6.25-15 MG/5ML syrup Take by mouth 4 (four) times daily as needed for cough.      . QUEtiapine (SEROQUEL) 50 MG tablet       . quinapril (ACCUPRIL) 40 MG tablet Take 40 mg by mouth at bedtime.      .  repaglinide (PRANDIN) 2 MG tablet Take 2 mg by mouth 3 (three) times daily before meals.      Marland Kitchen sulfamethoxazole-trimethoprim (BACTRIM DS,SEPTRA DS) 800-160 MG per tablet Take 1 tablet by mouth 2 (two) times daily.      Marland Kitchen SYNTHROID 50 MCG tablet        No current facility-administered medications for this visit.    Allergies as of 10/20/2013 - Review Complete 10/20/2013  Allergen Reaction Noted  . Atorvastatin  03/04/2013  . Macrobid [nitrofurantoin macrocrystal]  03/04/2013  . Niaspan [niacin er]  03/04/2013  . Victoza [liraglutide] Hives and Nausea And Vomiting 03/04/2013  . Penicillins Rash 03/04/2013    Vitals: BP 143/60  Pulse 70  Ht 5\' 1"  (1.549 m)  Wt 246 lb (111.585 kg)  BMI 46.51 kg/m2 Last Weight:  Wt Readings from Last 1 Encounters:  10/20/13 246 lb (111.585 kg)   Last Height:   Ht Readings from Last 1 Encounters:  10/20/13 5\' 1"  (1.549 m)     Physical exam: Exam: Gen: NAD, conversant Eyes: anicteric sclerae, moist conjunctivae HENT: Atraumatic, oropharynx clear Neck: Trachea midline; supple,  Lungs: CTA, no wheezing, rales, rhonic                          CV: RRR, no MRG Abdomen: Soft, non-tender;  Extremities: No peripheral edema  Skin: Normal temperature, no rash,  Psych: Appropriate affect, pleasant  Neuro: MS: AA&Ox3, appropriately interactive, normal affect   Speech: fluent w/o paraphasic error  Memory: good recent and remote recall  CN: PERRL, EOMI no nystagmus, no ptosis, sensation intact to LT V1-V3 bilat, face symmetric, no weakness, hearing grossly intact, palate elevates symmetrically, shoulder shrug 5/5 bilat,  tongue protrudes midline, no fasiculations noted.  Motor: normal bulk and tone Strength: 5/5  In all extremities  Coord: rapid alternating and point-to-point (FNF, HTS) movements intact.  Reflexes: symmetrical, bilat downgoing toes  Sens: LT intact in all extremities  Gait: posture, stance, stride and arm-swing normal.  Tandem gait intact. Able to walk on heels and toes. Romberg absent.   Assessment:  After physical and neurologic examination, review of laboratory studies, imaging, neurophysiology testing and pre-existing records, assessment will be reviewed on the problem list.  Plan:  Treatment plan and additional workup will be reviewed under Problem List.  1)Word finding difficulties: r/o TIA vs seizure 2)Meningioma  28 showed woman presenting for  initial evaluation of transient episode of word finding difficulty. Physical exam is unremarkable, MRI of the brain shows chronic small vessel disease and I small left frontal 7 mm meningioma. Patient has multiple risk factors for stroke including hypertension hyperlipidemia and diabetes. The differential of these episodes would include transient ischemic attack versus possible seizure secondary to meningioma. Patient to continue on aspirin 325 mg and Pravachol. Followup with primary care physician for optimization of medical risk factors. Will check brain EEG. Will repeat MRI of the brain in one year. If any change in symptoms would consider neurosurgical evaluation of meningioma. Followup in 12 months.

## 2013-10-21 ENCOUNTER — Ambulatory Visit (INDEPENDENT_AMBULATORY_CARE_PROVIDER_SITE_OTHER): Payer: Medicare Other | Admitting: Radiology

## 2013-10-21 DIAGNOSIS — R4182 Altered mental status, unspecified: Secondary | ICD-10-CM | POA: Diagnosis not present

## 2013-11-01 ENCOUNTER — Other Ambulatory Visit: Payer: Self-pay | Admitting: Hematology and Oncology

## 2013-11-03 NOTE — Procedures (Signed)
    History:  Yolanda Castaneda is a 78 year old patient with a history of 2 events of word finding difficulties unassociated with loss of consciousness. The patient has a history of visual migraine-type events. The patient is being evaluated for the episodes of word finding problems.  This is a routine EEG. No skull defects are noted. Medications include albuterol, aspirin, Flexeril, magnesium supplementation, multivitamins, fish oil, pravastatin, meclizine, Seroquel, Accupril, Prandin, Synthroid, and Bactrim.   EEG classification: Normal awake and drowsy  Description of the recording: The background rhythms of this recording consists of a fairly well modulated medium amplitude alpha rhythm of 9 Hz that is reactive to eye opening and closure. As the record progresses, the patient appears to remain in the waking state throughout the recording. Photic stimulation was performed, resulting in a bilateral and symmetric photic driving response. Hyperventilation was not performed. Toward the end of the recording, the patient enters the drowsy state with slight symmetric slowing seen. The patient never enters stage II sleep. At no time during the recording does there appear to be evidence of spike or spike wave discharges or evidence of focal slowing. EKG monitor shows no evidence of cardiac rhythm abnormalities with a heart rate of 72.  Impression: This is a normal EEG recording in the waking and drowsy state. No evidence of ictal or interictal discharges are seen.

## 2013-11-04 DIAGNOSIS — E039 Hypothyroidism, unspecified: Secondary | ICD-10-CM | POA: Diagnosis not present

## 2013-11-04 DIAGNOSIS — G479 Sleep disorder, unspecified: Secondary | ICD-10-CM | POA: Diagnosis not present

## 2013-11-04 DIAGNOSIS — R609 Edema, unspecified: Secondary | ICD-10-CM | POA: Diagnosis not present

## 2013-11-04 DIAGNOSIS — R635 Abnormal weight gain: Secondary | ICD-10-CM | POA: Diagnosis not present

## 2013-11-04 DIAGNOSIS — E119 Type 2 diabetes mellitus without complications: Secondary | ICD-10-CM | POA: Diagnosis not present

## 2013-11-25 DIAGNOSIS — E039 Hypothyroidism, unspecified: Secondary | ICD-10-CM | POA: Diagnosis not present

## 2013-11-25 DIAGNOSIS — E7800 Pure hypercholesterolemia, unspecified: Secondary | ICD-10-CM | POA: Diagnosis not present

## 2013-11-25 DIAGNOSIS — G479 Sleep disorder, unspecified: Secondary | ICD-10-CM | POA: Diagnosis not present

## 2013-11-25 DIAGNOSIS — I10 Essential (primary) hypertension: Secondary | ICD-10-CM | POA: Diagnosis not present

## 2013-11-25 DIAGNOSIS — E119 Type 2 diabetes mellitus without complications: Secondary | ICD-10-CM | POA: Diagnosis not present

## 2013-11-25 DIAGNOSIS — Z23 Encounter for immunization: Secondary | ICD-10-CM | POA: Diagnosis not present

## 2013-12-02 ENCOUNTER — Telehealth: Payer: Self-pay | Admitting: Interventional Cardiology

## 2013-12-02 NOTE — Telephone Encounter (Signed)
New problem   Need to know when and if Dr Katrinka Blazing recommend pt to have another Echocardiogram. Please Francee Piccolo

## 2013-12-03 NOTE — Telephone Encounter (Signed)
Only if cardiac symptoms.

## 2013-12-03 NOTE — Telephone Encounter (Signed)
Only if cardiac symptoms. 

## 2013-12-07 NOTE — Telephone Encounter (Signed)
lmom.called home and cell...pt does not need repeat echo if she is not having cardiac symptoms.

## 2013-12-22 DIAGNOSIS — H00029 Hordeolum internum unspecified eye, unspecified eyelid: Secondary | ICD-10-CM | POA: Diagnosis not present

## 2013-12-22 DIAGNOSIS — E109 Type 1 diabetes mellitus without complications: Secondary | ICD-10-CM | POA: Diagnosis not present

## 2013-12-22 DIAGNOSIS — H25019 Cortical age-related cataract, unspecified eye: Secondary | ICD-10-CM | POA: Diagnosis not present

## 2013-12-22 DIAGNOSIS — H353190 Nonexudative age-related macular degeneration, unspecified eye, stage unspecified: Secondary | ICD-10-CM | POA: Diagnosis not present

## 2013-12-22 DIAGNOSIS — H40059 Ocular hypertension, unspecified eye: Secondary | ICD-10-CM | POA: Diagnosis not present

## 2013-12-28 DIAGNOSIS — Z91040 Latex allergy status: Secondary | ICD-10-CM | POA: Diagnosis not present

## 2013-12-31 DIAGNOSIS — H2510 Age-related nuclear cataract, unspecified eye: Secondary | ICD-10-CM | POA: Diagnosis not present

## 2013-12-31 DIAGNOSIS — Z01818 Encounter for other preprocedural examination: Secondary | ICD-10-CM | POA: Diagnosis not present

## 2014-01-04 DIAGNOSIS — H2510 Age-related nuclear cataract, unspecified eye: Secondary | ICD-10-CM | POA: Diagnosis not present

## 2014-01-06 DIAGNOSIS — H2510 Age-related nuclear cataract, unspecified eye: Secondary | ICD-10-CM | POA: Diagnosis not present

## 2014-01-07 DIAGNOSIS — H2510 Age-related nuclear cataract, unspecified eye: Secondary | ICD-10-CM | POA: Diagnosis not present

## 2014-01-21 DIAGNOSIS — H2510 Age-related nuclear cataract, unspecified eye: Secondary | ICD-10-CM | POA: Diagnosis not present

## 2014-02-09 DIAGNOSIS — H918X9 Other specified hearing loss, unspecified ear: Secondary | ICD-10-CM | POA: Diagnosis not present

## 2014-02-15 DIAGNOSIS — M25569 Pain in unspecified knee: Secondary | ICD-10-CM | POA: Diagnosis not present

## 2014-02-15 DIAGNOSIS — M6281 Muscle weakness (generalized): Secondary | ICD-10-CM | POA: Diagnosis not present

## 2014-02-15 DIAGNOSIS — M2560 Stiffness of unspecified joint, not elsewhere classified: Secondary | ICD-10-CM | POA: Diagnosis not present

## 2014-02-15 DIAGNOSIS — R6889 Other general symptoms and signs: Secondary | ICD-10-CM | POA: Diagnosis not present

## 2014-02-17 DIAGNOSIS — M2560 Stiffness of unspecified joint, not elsewhere classified: Secondary | ICD-10-CM | POA: Diagnosis not present

## 2014-02-17 DIAGNOSIS — M25569 Pain in unspecified knee: Secondary | ICD-10-CM | POA: Diagnosis not present

## 2014-02-17 DIAGNOSIS — R6889 Other general symptoms and signs: Secondary | ICD-10-CM | POA: Diagnosis not present

## 2014-02-17 DIAGNOSIS — M6281 Muscle weakness (generalized): Secondary | ICD-10-CM | POA: Diagnosis not present

## 2014-02-19 DIAGNOSIS — M6281 Muscle weakness (generalized): Secondary | ICD-10-CM | POA: Diagnosis not present

## 2014-02-19 DIAGNOSIS — R6889 Other general symptoms and signs: Secondary | ICD-10-CM | POA: Diagnosis not present

## 2014-02-19 DIAGNOSIS — M25569 Pain in unspecified knee: Secondary | ICD-10-CM | POA: Diagnosis not present

## 2014-02-19 DIAGNOSIS — M2560 Stiffness of unspecified joint, not elsewhere classified: Secondary | ICD-10-CM | POA: Diagnosis not present

## 2014-03-10 DIAGNOSIS — M25569 Pain in unspecified knee: Secondary | ICD-10-CM | POA: Diagnosis not present

## 2014-03-16 DIAGNOSIS — M25569 Pain in unspecified knee: Secondary | ICD-10-CM | POA: Diagnosis not present

## 2014-03-24 DIAGNOSIS — M25569 Pain in unspecified knee: Secondary | ICD-10-CM | POA: Diagnosis not present

## 2014-05-17 DIAGNOSIS — E119 Type 2 diabetes mellitus without complications: Secondary | ICD-10-CM | POA: Diagnosis not present

## 2014-05-17 DIAGNOSIS — H2640 Unspecified secondary cataract: Secondary | ICD-10-CM | POA: Diagnosis not present

## 2014-05-17 DIAGNOSIS — H04209 Unspecified epiphora, unspecified lacrimal gland: Secondary | ICD-10-CM | POA: Diagnosis not present

## 2014-05-17 DIAGNOSIS — H02839 Dermatochalasis of unspecified eye, unspecified eyelid: Secondary | ICD-10-CM | POA: Diagnosis not present

## 2014-05-17 DIAGNOSIS — H02409 Unspecified ptosis of unspecified eyelid: Secondary | ICD-10-CM | POA: Diagnosis not present

## 2014-05-17 DIAGNOSIS — Z961 Presence of intraocular lens: Secondary | ICD-10-CM | POA: Diagnosis not present

## 2014-05-22 IMAGING — US US SOFT TISSUE HEAD/NECK
1 series · 4 of 4 positions shown · non-contrast
Comparison: None.

CLINICAL DATA: Soft tissue mass in the anterior superior aspect of
the right side of the neck.

ULTRASOUND OF HEAD/NECK SOFT TISSUES
TECHNIQUE: Ultrasound examination of the head and neck soft
tissues was performed in the area of clinical concern.

[Series 1: us soft tissue head/neck · 0.06mm/px · 4 of 4 slices shown]
[im 1/4]
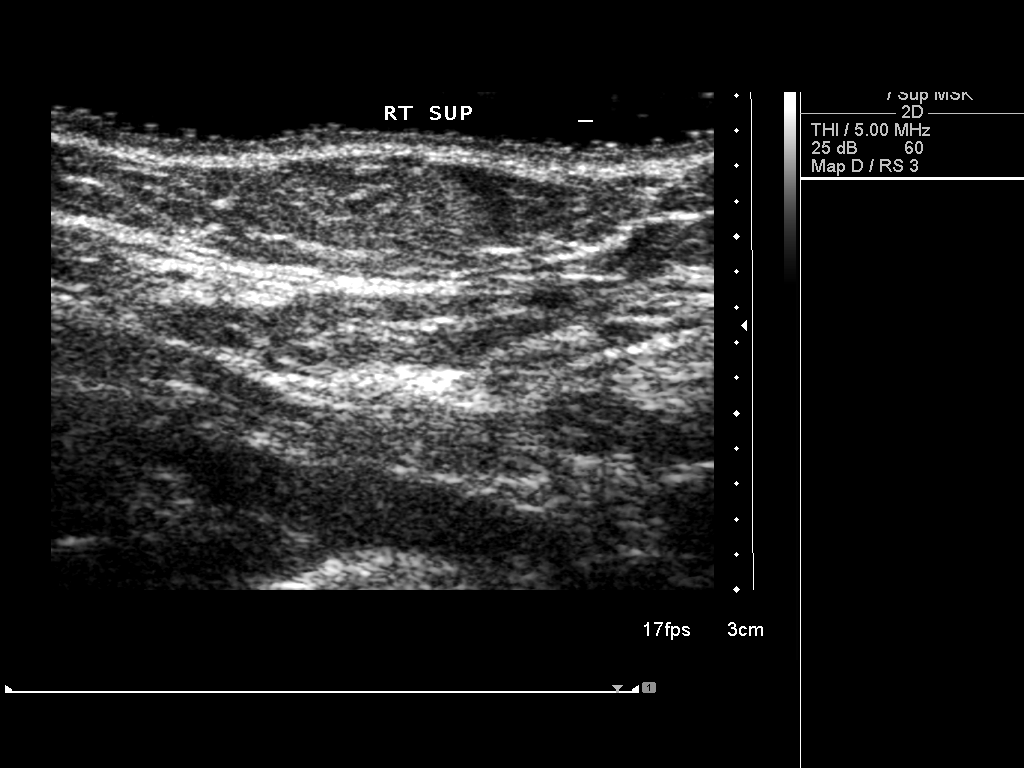
[im 2/4]
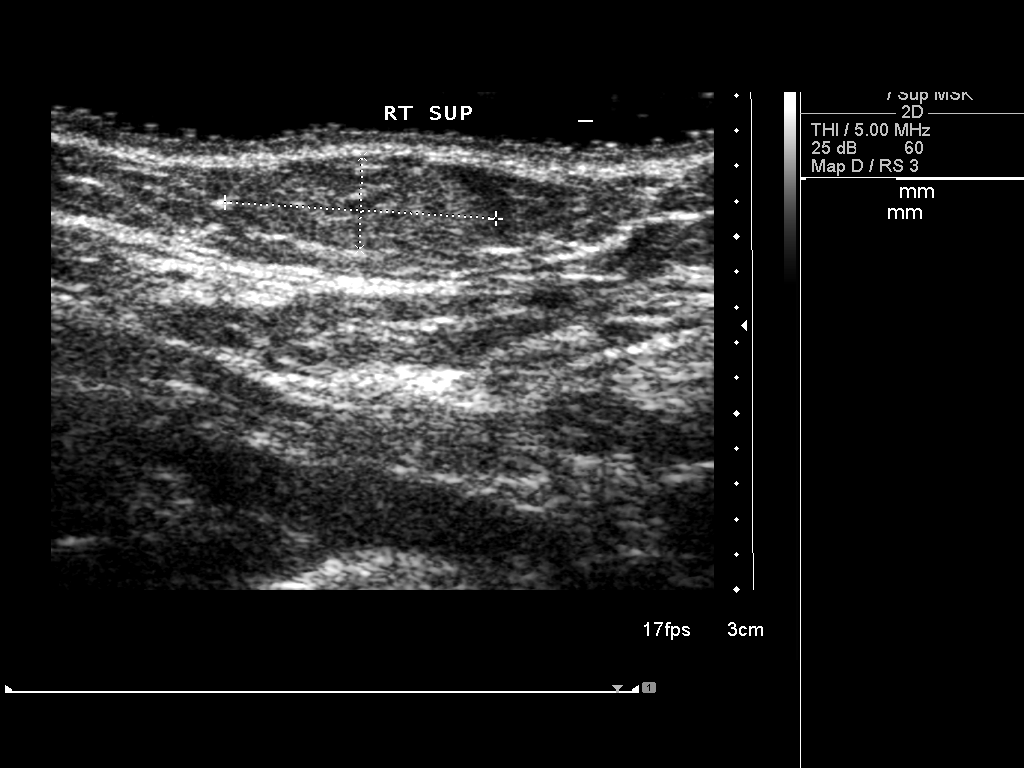
[im 3/4]
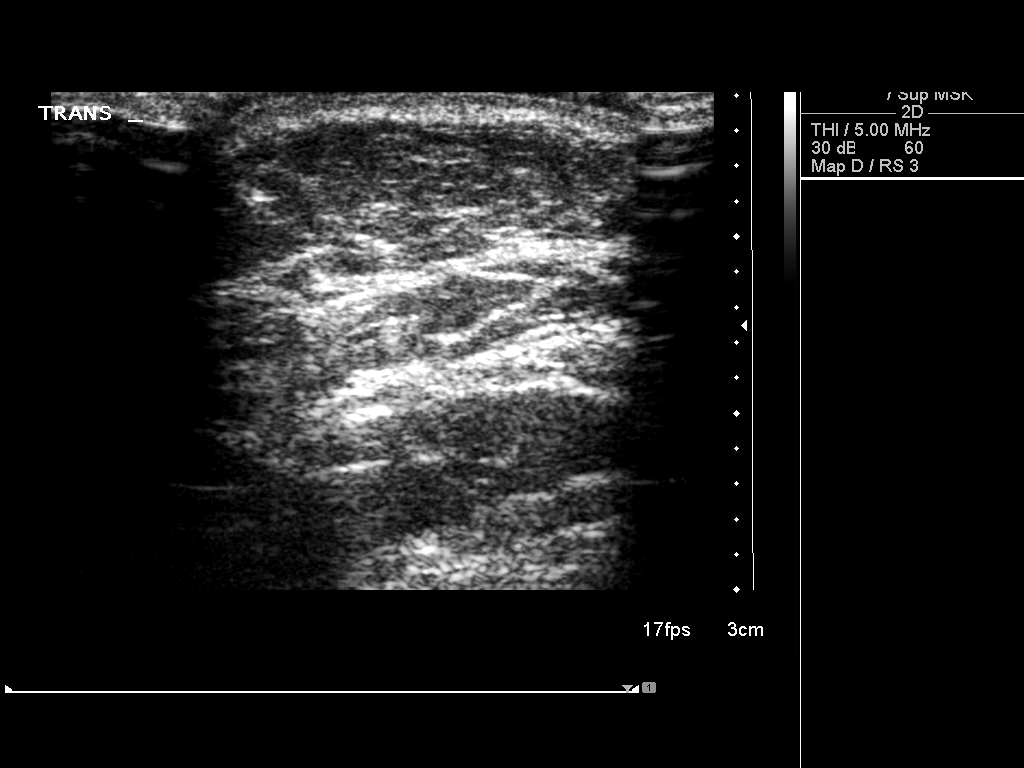
[im 4/4]
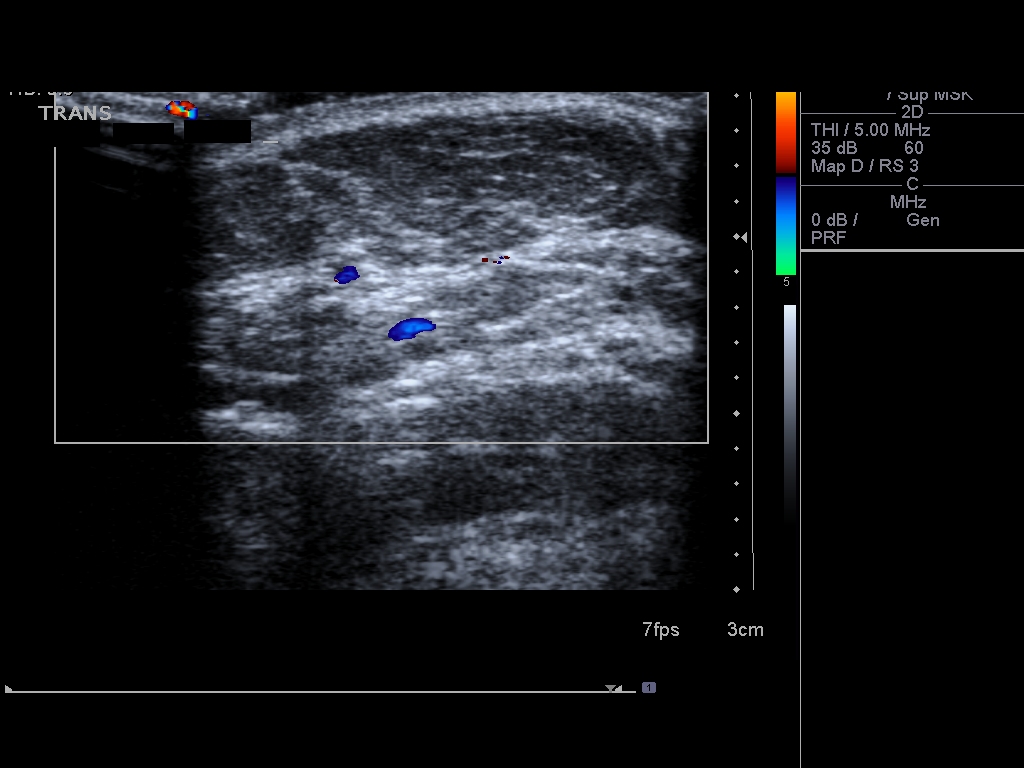

[4 of 4 positions shown; findings below may reference images not displayed]

FINDINGS: Real time ultrasound was performed using a 13.5 MHz
transducer.  The visible and palpable abnormality is demonstrated
to be a small benign-appearing subcutaneous lipoma measuring 15.4 x
5.3 mm.  No other abnormalities.
IMPRESSION: Benign small subcutaneous lipoma.

## 2014-06-15 DIAGNOSIS — R079 Chest pain, unspecified: Secondary | ICD-10-CM | POA: Diagnosis not present

## 2014-06-15 DIAGNOSIS — Z0000 Encounter for general adult medical examination without abnormal findings: Secondary | ICD-10-CM | POA: Diagnosis not present

## 2014-06-15 DIAGNOSIS — E7800 Pure hypercholesterolemia, unspecified: Secondary | ICD-10-CM | POA: Diagnosis not present

## 2014-06-15 DIAGNOSIS — E119 Type 2 diabetes mellitus without complications: Secondary | ICD-10-CM | POA: Diagnosis not present

## 2014-06-15 DIAGNOSIS — E039 Hypothyroidism, unspecified: Secondary | ICD-10-CM | POA: Diagnosis not present

## 2014-06-15 DIAGNOSIS — G479 Sleep disorder, unspecified: Secondary | ICD-10-CM | POA: Diagnosis not present

## 2014-06-15 DIAGNOSIS — I10 Essential (primary) hypertension: Secondary | ICD-10-CM | POA: Diagnosis not present

## 2014-07-09 DIAGNOSIS — S83289A Other tear of lateral meniscus, current injury, unspecified knee, initial encounter: Secondary | ICD-10-CM | POA: Diagnosis not present

## 2014-07-09 DIAGNOSIS — M25569 Pain in unspecified knee: Secondary | ICD-10-CM | POA: Diagnosis not present

## 2014-07-19 DIAGNOSIS — M7120 Synovial cyst of popliteal space [Baker], unspecified knee: Secondary | ICD-10-CM | POA: Diagnosis not present

## 2014-07-19 DIAGNOSIS — S83289A Other tear of lateral meniscus, current injury, unspecified knee, initial encounter: Secondary | ICD-10-CM | POA: Diagnosis not present

## 2014-07-19 DIAGNOSIS — M25569 Pain in unspecified knee: Secondary | ICD-10-CM | POA: Diagnosis not present

## 2014-07-28 DIAGNOSIS — M5137 Other intervertebral disc degeneration, lumbosacral region: Secondary | ICD-10-CM | POA: Diagnosis not present

## 2014-07-28 DIAGNOSIS — M999 Biomechanical lesion, unspecified: Secondary | ICD-10-CM | POA: Diagnosis not present

## 2014-07-28 DIAGNOSIS — M9981 Other biomechanical lesions of cervical region: Secondary | ICD-10-CM | POA: Diagnosis not present

## 2014-08-03 DIAGNOSIS — M9981 Other biomechanical lesions of cervical region: Secondary | ICD-10-CM | POA: Diagnosis not present

## 2014-08-03 DIAGNOSIS — M5137 Other intervertebral disc degeneration, lumbosacral region: Secondary | ICD-10-CM | POA: Diagnosis not present

## 2014-08-03 DIAGNOSIS — M999 Biomechanical lesion, unspecified: Secondary | ICD-10-CM | POA: Diagnosis not present

## 2014-08-05 DIAGNOSIS — M25569 Pain in unspecified knee: Secondary | ICD-10-CM | POA: Diagnosis not present

## 2014-08-09 DIAGNOSIS — M25569 Pain in unspecified knee: Secondary | ICD-10-CM | POA: Diagnosis not present

## 2014-08-10 DIAGNOSIS — Z961 Presence of intraocular lens: Secondary | ICD-10-CM | POA: Diagnosis not present

## 2014-08-10 DIAGNOSIS — H04209 Unspecified epiphora, unspecified lacrimal gland: Secondary | ICD-10-CM | POA: Diagnosis not present

## 2014-08-13 DIAGNOSIS — M9981 Other biomechanical lesions of cervical region: Secondary | ICD-10-CM | POA: Diagnosis not present

## 2014-08-13 DIAGNOSIS — M5137 Other intervertebral disc degeneration, lumbosacral region: Secondary | ICD-10-CM | POA: Diagnosis not present

## 2014-08-13 DIAGNOSIS — M999 Biomechanical lesion, unspecified: Secondary | ICD-10-CM | POA: Diagnosis not present

## 2014-08-25 DIAGNOSIS — M23302 Other meniscus derangements, unspecified lateral meniscus, unspecified knee: Secondary | ICD-10-CM | POA: Diagnosis not present

## 2014-08-25 DIAGNOSIS — M23305 Other meniscus derangements, unspecified medial meniscus, unspecified knee: Secondary | ICD-10-CM | POA: Diagnosis not present

## 2014-08-25 DIAGNOSIS — M9420 Chondromalacia, unspecified site: Secondary | ICD-10-CM | POA: Diagnosis not present

## 2014-08-25 DIAGNOSIS — G8918 Other acute postprocedural pain: Secondary | ICD-10-CM | POA: Diagnosis not present

## 2014-08-25 DIAGNOSIS — S83289A Other tear of lateral meniscus, current injury, unspecified knee, initial encounter: Secondary | ICD-10-CM | POA: Diagnosis not present

## 2014-08-25 DIAGNOSIS — IMO0002 Reserved for concepts with insufficient information to code with codable children: Secondary | ICD-10-CM | POA: Diagnosis not present

## 2014-08-25 DIAGNOSIS — M2240 Chondromalacia patellae, unspecified knee: Secondary | ICD-10-CM | POA: Diagnosis not present

## 2014-08-31 DIAGNOSIS — I10 Essential (primary) hypertension: Secondary | ICD-10-CM | POA: Diagnosis not present

## 2014-08-31 DIAGNOSIS — R42 Dizziness and giddiness: Secondary | ICD-10-CM | POA: Diagnosis not present

## 2014-08-31 DIAGNOSIS — E7800 Pure hypercholesterolemia, unspecified: Secondary | ICD-10-CM | POA: Diagnosis not present

## 2014-08-31 DIAGNOSIS — K219 Gastro-esophageal reflux disease without esophagitis: Secondary | ICD-10-CM | POA: Diagnosis not present

## 2014-08-31 DIAGNOSIS — E1149 Type 2 diabetes mellitus with other diabetic neurological complication: Secondary | ICD-10-CM | POA: Diagnosis not present

## 2014-08-31 DIAGNOSIS — E039 Hypothyroidism, unspecified: Secondary | ICD-10-CM | POA: Diagnosis not present

## 2014-08-31 DIAGNOSIS — M25569 Pain in unspecified knee: Secondary | ICD-10-CM | POA: Diagnosis not present

## 2014-08-31 DIAGNOSIS — M5430 Sciatica, unspecified side: Secondary | ICD-10-CM | POA: Diagnosis not present

## 2014-09-07 DIAGNOSIS — M5137 Other intervertebral disc degeneration, lumbosacral region: Secondary | ICD-10-CM | POA: Diagnosis not present

## 2014-09-07 DIAGNOSIS — M999 Biomechanical lesion, unspecified: Secondary | ICD-10-CM | POA: Diagnosis not present

## 2014-09-07 DIAGNOSIS — M9981 Other biomechanical lesions of cervical region: Secondary | ICD-10-CM | POA: Diagnosis not present

## 2014-09-16 DIAGNOSIS — H8110 Benign paroxysmal vertigo, unspecified ear: Secondary | ICD-10-CM | POA: Diagnosis not present

## 2014-09-17 DIAGNOSIS — M5136 Other intervertebral disc degeneration, lumbar region: Secondary | ICD-10-CM | POA: Diagnosis not present

## 2014-09-17 DIAGNOSIS — M9903 Segmental and somatic dysfunction of lumbar region: Secondary | ICD-10-CM | POA: Diagnosis not present

## 2014-09-17 DIAGNOSIS — M5137 Other intervertebral disc degeneration, lumbosacral region: Secondary | ICD-10-CM | POA: Diagnosis not present

## 2014-09-17 DIAGNOSIS — M9901 Segmental and somatic dysfunction of cervical region: Secondary | ICD-10-CM | POA: Diagnosis not present

## 2014-09-17 DIAGNOSIS — M9902 Segmental and somatic dysfunction of thoracic region: Secondary | ICD-10-CM | POA: Diagnosis not present

## 2014-10-20 ENCOUNTER — Encounter: Payer: Self-pay | Admitting: Neurology

## 2014-10-20 ENCOUNTER — Ambulatory Visit (INDEPENDENT_AMBULATORY_CARE_PROVIDER_SITE_OTHER): Payer: Medicare Other | Admitting: Neurology

## 2014-10-20 VITALS — BP 144/59 | HR 89 | Temp 98.3°F | Ht 61.0 in | Wt 232.0 lb

## 2014-10-20 DIAGNOSIS — H8110 Benign paroxysmal vertigo, unspecified ear: Secondary | ICD-10-CM

## 2014-10-20 DIAGNOSIS — R42 Dizziness and giddiness: Secondary | ICD-10-CM | POA: Diagnosis not present

## 2014-10-20 DIAGNOSIS — G609 Hereditary and idiopathic neuropathy, unspecified: Secondary | ICD-10-CM | POA: Diagnosis not present

## 2014-10-20 DIAGNOSIS — D329 Benign neoplasm of meninges, unspecified: Secondary | ICD-10-CM | POA: Diagnosis not present

## 2014-10-20 DIAGNOSIS — G3184 Mild cognitive impairment, so stated: Secondary | ICD-10-CM

## 2014-10-20 NOTE — Progress Notes (Signed)
[facility]    Provider: Dr Antony Haste Referring Provider: Melinda Crutch, MD Primary Care Physician:  Melinda Crutch, MD\  CC: abnormal brain mri and word-finding difficulties  HPI: Yolanda Castaneda is a 79 y.o. female here as a follow-up for abnormal brain MRI and word-finding difficulty. Last seen by Dr. Janann Colonel and is transferring care.  Had 2 brief episodes of word finding difficulties over a year ago, she was aware of it/her surroundings the whole time, no automatisms during the event, patient reports it was awhile since she had eaten, raised concern that her sugars were low but did not eat anything and it went away on its own. Just couldn't get the words out, lasted 20 seconds. She reports no similar events in the last year. Workup at that time revealed a meningioma. She denies headaches but she coughed the othe day and felt pressure in the top of her head. No vision changes, no speech changes, no other focal neurologic symptoms.  She had vertigo 3 weeks ago and saw an ENT, vomited with room spinning. She did the Epley Maneuver which resolved the situation. No vertigo since.   Feels she has some cognitive changes, can't remember people's names from church and has known them for years but she will eventually remember, her concentration is not as good, no depression or anxiety, no hallucinations or delusions, living independently and paying own bills without problems. She takes seroquel at night and melatonin for sleep.  Denies snoring or apneic events.  For years have seen geometric patterns of flashing lights, comes and goes, lasts around 1 minute and then resolves. Has been going on 25 years. Happens once a week. No migraine headaches. No seizure history.   Had stroke workup done by PCP including unremarkable carotid ultrasound, MRI brain.   Reviewed notes, labs and personally reviewed imaging from outside physician, pertinent for prior the brain showing chronic small vessel  disease, possible old infarct and a 7 mm meningioma in the left paracentral region.   Review of Systems: Out of a complete 14 system review, the patient complains of only the following symptoms, and all other reviewed systems are negative. Positive for weight gain, memory loss, daytime sleepiness, walking difficulty due to knee surgery.   History   Social History  . Marital Status: Widowed    Spouse Name: N/A    Number of Children: 2  . Years of Education: BS   Occupational History  . Not on file.   Social History Main Topics  . Smoking status: Never Smoker   . Smokeless tobacco: Never Used  . Alcohol Use: No  . Drug Use: No  . Sexual Activity: Not on file   Other Topics Concern  . Not on file   Social History Narrative   Patient lives at home alone.    Patient is retired.    Patient is widowed.    Patient has 2 daughters.    Patient has her BS    Family History  Problem Relation Age of Onset  . Transient ischemic attack Father   . Ovarian cancer Mother   . Prostate cancer Brother     Past Medical History  Diagnosis Date  . Hypercholesteremia   . Hypertension   . Diabetes mellitus   . OA (osteoarthritis)   . 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 Cholecystectomy  1978  . Cesarean section  1961  Current Outpatient Prescriptions  Medication Sig Dispense Refill  . acetaminophen (TYLENOL) 325 MG tablet Take 650 mg by mouth every 6 (six) hours as needed for pain.    Marland Kitchen aspirin 81 MG tablet Take 81 mg by mouth daily.    Marland Kitchen GLIPIZIDE PO Take 4 mg by mouth.    . Homeopathic Products ([employer] LEG CRAMPS PAIN RELIEF PO) Take by mouth as needed.    . Magnesium 200 MG TABS Take 1 tablet by mouth daily.    . Multiple Vitamins-Minerals (CENTRUM SILVER PO) Take 1 tablet by mouth daily.    . naproxen sodium (ANAPROX) 220 MG tablet Take 220 mg by mouth 2 (two) times daily with a meal.    . Omega-3 Fatty  Acids (FISH OIL) 1000 MG CAPS Take 1 capsule by mouth daily.    . pravastatin (PRAVACHOL) 80 MG tablet 80 mg.    . QUEtiapine (SEROQUEL) 50 MG tablet     . quinapril (ACCUPRIL) 40 MG tablet Take 40 mg by mouth at bedtime.    Marland Kitchen SYNTHROID 50 MCG tablet     . albuterol (PROVENTIL) (2.5 MG/3ML) 0.083% nebulizer solution Take 2.5 mg by nebulization every 6 (six) hours as needed for wheezing.    . cyclobenzaprine (FLEXERIL) 10 MG tablet Take 10 mg by mouth 3 (three) times daily as needed for muscle spasms.    . Doxylamine Succinate, Sleep, (SLEEP AID PO) Take by mouth as needed.    . pioglitazone (ACTOS) 45 MG tablet Take 45 mg by mouth daily.    . Potassium 95 MG TABS Take 1 tablet by mouth daily.    . promethazine-dextromethorphan (PROMETHAZINE-DM) 6.25-15 MG/5ML syrup Take by mouth 4 (four) times daily as needed for cough.    . repaglinide (PRANDIN) 2 MG tablet Take 2 mg by mouth 3 (three) times daily before meals.    Marland Kitchen sulfamethoxazole-trimethoprim (BACTRIM DS,SEPTRA DS) 800-160 MG per tablet Take 1 tablet by mouth 2 (two) times daily.     No current facility-administered medications for this visit.    Allergies as of 10/20/2014 - Review Complete 10/20/2014  Allergen Reaction Noted  . Atorvastatin  03/04/2013  . Macrobid [nitrofurantoin macrocrystal]  03/04/2013  . Niaspan [niacin er]  03/04/2013  . Victoza [liraglutide] Hives and Nausea And Vomiting 03/04/2013  . Penicillins Rash 03/04/2013    Vitals: BP 144/59 mmHg  Pulse 89  Temp(Src) 98.3 F (36.8 C) (Oral)  Ht 5\' 1"  (1.549 m)  Wt 232 lb (105.235 kg)  BMI 43.86 kg/m2 Last Weight:  Wt Readings from Last 1 Encounters:  10/20/14 232 lb (105.235 kg)   Last Height:   Ht Readings from Last 1 Encounters:  10/20/14 5\' 1"  (1.549 m)   Physical exam: Exam: Gen: NAD, conversant, well nourised, obese, well groomed                     CV: RRR, no MRG. No Carotid Bruits. Eyes: Conjunctivae clear without exudates or  hemorrhage  Neuro: Detailed Neurologic Exam  Speech:    Speech is normal; fluent and spontaneous with normal comprehension.  Cognition:    The patient is oriented to person, place, and time;     recent and remote memory intact;     language fluent;     normal attention, concentration,     fund of knowledge Cranial Nerves:    The pupils are equal, round, and reactive to light. The fundi are flat, no papilledema. Visual fields are full to finger  confrontation. Extraocular movements are intact. Trigeminal sensation is intact and the muscles of mastication are normal. The face is symmetric. The palate elevates in the midline. Voice is normal. Shoulder shrug is normal. The tongue has normal motion without fasciculations.   Coordination:    No dysmetria  Gait:    Normal native gait  Motor Observation:    No asymmetry, no atrophy, and no involuntary movements noted. Tone:    Normal muscle tone.    Posture:    Posture is normal. normal erect    Strength:    Mild hip flexion weakness bilat otherwise strength is V/V in the upper and lower limbs.      Sensation: intact to LT     Reflex Exam:  DTR's:    Absent achilles otherwise deep tendon reflexes in the upper and lower extremities are normal bilaterally.  Could not test left patellar due to pain. Toes:    The toes are downgoing bilaterally.   Clonus:    Clonus is absent.    Assessment/Plan:  87 showed woman presenting for follow up of transient episode of word finding difficulty over a year ago. Neuro exam is unremarkable, MRI of the brain shows chronic small vessel disease and a small left frontal 7 mm meningioma. Patient has multiple risk factors for stroke including hypertension, hyperlipidemia and diabetes. The differential of these episodes would include transient ischemic attack versus possible seizure secondary to meningioma. EEG was negative and she has not had another event in one year. Patient to continue on aspirin 325  mg and Pravachol. Followup with primary care physician for optimization of medical risk factors. Will repeat MRI of the brain. If any change in symptoms would consider neurosurgical evaluation of meningioma. Followup in 6 months. Will order BUN/Cr lab tests.  I don't have a record of all the labs primary care physician has completed so I suggest next time patient in their office to ask if she has ever had B12 and Folate completed and let them know that your neurologist recommends that testing for cognitive impairment. Suggested a life alert device (patient lives a lone) as well as Aricept. She will get back to me on Aricept. If stable, repeat MRI brain yearly.  Sarina Ill, MD  [facility] [address] [place], [place] [address]  Phone [phone] Fax [phone]

## 2014-10-20 NOTE — Patient Instructions (Addendum)
Overall you are doing fairly well but I do want to suggest a few things today:   Remember to drink plenty of fluid, eat healthy meals and do not skip any meals. Try to eat protein with a every meal and eat a healthy snack such as fruit or nuts in between meals. Try to keep a regular sleep-wake schedule and try to exercise daily, particularly in the form of walking, 20-30 minutes a day, if you can.   As far as your medications are concerned, I would like to suggest  As far as diagnostic testing: MRi of the brain, lab tests. I don't have a record of all the labs your primary care physician has completed so I suggest next time you are in their office to ask if you have ever had B12 and Folate completed and let them know that your neurologist recommends that testing for cognitive impairment.   I would like to see you back in 6 months, sooner if we need to. Please call us with any interim questions, concerns, problems, updates or refill requests.   Please also call us for any test results so we can go over those with you on the phone.  My clinical assistant and will answer any of your questions and relay your messages to me and also relay most of my messages to you.   Our phone number is [phone]. We also have an after hours call service for urgent matters and there is a physician on-call for urgent questions. For any emergencies you know to call 911 or go to the nearest emergency room   Consider Aricept as well as a Life Alert bracelet.

## 2014-10-21 LAB — CREATININE, SERUM
Creatinine, Ser: 0.7 mg/dL (ref 0.57–1.00)
GFR, EST AFRICAN AMERICAN: 97 mL/min/{1.73_m2} (ref >59)
GFR, EST NON AFRICAN AMERICAN: 84 mL/min/{1.73_m2} (ref >59)

## 2014-10-21 LAB — BUN: BUN: 17 mg/dL (ref 8–27)

## 2014-11-05 ENCOUNTER — Ambulatory Visit
Admission: RE | Admit: 2014-11-05 | Discharge: 2014-11-05 | Disposition: A | Discharge status: Home / Self Care | Payer: Medicare Other | Source: Ambulatory Visit | Attending: Neurology | Admitting: Neurology

## 2014-11-05 ENCOUNTER — Other Ambulatory Visit: Payer: Self-pay | Admitting: Neurology

## 2014-11-05 DIAGNOSIS — G3184 Mild cognitive impairment, so stated: Secondary | ICD-10-CM

## 2014-11-05 DIAGNOSIS — D329 Benign neoplasm of meninges, unspecified: Secondary | ICD-10-CM | POA: Diagnosis not present

## 2014-11-10 ENCOUNTER — Telehealth: Payer: Self-pay | Admitting: Neurology

## 2014-11-10 NOTE — Telephone Encounter (Signed)
Left message for patient regarding MRI results. No significant changes since 2014    MRI 11/05/2014: Abnormal MRI scan of the brain showing mild changes of age-appropriate chronic microvascular ischemia and generalized cerebral atrophy. Remote tiny infarct left cerebellum.7 mm left paracentral found to convexity region skull base to lesion likely a meningioma which appears unchanged compared with previous MRI dated 10/09/2013

## 2014-11-17 NOTE — Telephone Encounter (Signed)
Returned call and discussed MRI brain, meningioma is stable, no acute abnormalities. She can follow up with me in the office.

## 2014-11-17 NOTE — Telephone Encounter (Signed)
Patient returning call regarding MRI results, please call after 4:30 pm today.  Please call and advise.

## 2014-11-19 DIAGNOSIS — Z23 Encounter for immunization: Secondary | ICD-10-CM | POA: Diagnosis not present

## 2014-11-19 DIAGNOSIS — I10 Essential (primary) hypertension: Secondary | ICD-10-CM | POA: Diagnosis not present

## 2014-11-19 DIAGNOSIS — E119 Type 2 diabetes mellitus without complications: Secondary | ICD-10-CM | POA: Diagnosis not present

## 2014-12-24 DIAGNOSIS — H6123 Impacted cerumen, bilateral: Secondary | ICD-10-CM | POA: Diagnosis not present

## 2015-01-13 DIAGNOSIS — M545 Low back pain: Secondary | ICD-10-CM | POA: Diagnosis not present

## 2015-01-28 DIAGNOSIS — I10 Essential (primary) hypertension: Secondary | ICD-10-CM | POA: Diagnosis not present

## 2015-01-28 DIAGNOSIS — E1165 Type 2 diabetes mellitus with hyperglycemia: Secondary | ICD-10-CM | POA: Diagnosis not present

## 2015-01-28 DIAGNOSIS — M79606 Pain in leg, unspecified: Secondary | ICD-10-CM | POA: Diagnosis not present

## 2015-01-28 DIAGNOSIS — E039 Hypothyroidism, unspecified: Secondary | ICD-10-CM | POA: Diagnosis not present

## 2015-01-28 DIAGNOSIS — E780 Pure hypercholesterolemia: Secondary | ICD-10-CM | POA: Diagnosis not present

## 2015-01-28 DIAGNOSIS — E119 Type 2 diabetes mellitus without complications: Secondary | ICD-10-CM | POA: Diagnosis not present

## 2015-04-25 DIAGNOSIS — M25562 Pain in left knee: Secondary | ICD-10-CM | POA: Diagnosis not present

## 2015-04-25 DIAGNOSIS — M25561 Pain in right knee: Secondary | ICD-10-CM | POA: Diagnosis not present

## 2015-04-25 DIAGNOSIS — M170 Bilateral primary osteoarthritis of knee: Secondary | ICD-10-CM | POA: Diagnosis not present

## 2015-04-28 DIAGNOSIS — E1165 Type 2 diabetes mellitus with hyperglycemia: Secondary | ICD-10-CM | POA: Diagnosis not present

## 2015-04-28 DIAGNOSIS — I10 Essential (primary) hypertension: Secondary | ICD-10-CM | POA: Diagnosis not present

## 2015-05-06 DIAGNOSIS — H6123 Impacted cerumen, bilateral: Secondary | ICD-10-CM | POA: Diagnosis not present

## 2015-05-06 DIAGNOSIS — H9190 Unspecified hearing loss, unspecified ear: Secondary | ICD-10-CM | POA: Diagnosis not present

## 2015-05-06 DIAGNOSIS — M79669 Pain in unspecified lower leg: Secondary | ICD-10-CM | POA: Diagnosis not present

## 2015-05-19 DIAGNOSIS — E119 Type 2 diabetes mellitus without complications: Secondary | ICD-10-CM | POA: Diagnosis not present

## 2015-05-19 DIAGNOSIS — H43811 Vitreous degeneration, right eye: Secondary | ICD-10-CM | POA: Diagnosis not present

## 2015-05-19 DIAGNOSIS — H04123 Dry eye syndrome of bilateral lacrimal glands: Secondary | ICD-10-CM | POA: Diagnosis not present

## 2015-05-19 DIAGNOSIS — H3531 Nonexudative age-related macular degeneration: Secondary | ICD-10-CM | POA: Diagnosis not present

## 2015-06-30 DIAGNOSIS — L309 Dermatitis, unspecified: Secondary | ICD-10-CM | POA: Diagnosis not present

## 2015-07-01 DIAGNOSIS — R21 Rash and other nonspecific skin eruption: Secondary | ICD-10-CM | POA: Diagnosis not present

## 2015-07-07 DIAGNOSIS — R21 Rash and other nonspecific skin eruption: Secondary | ICD-10-CM | POA: Diagnosis not present

## 2015-08-08 DIAGNOSIS — E780 Pure hypercholesterolemia: Secondary | ICD-10-CM | POA: Diagnosis not present

## 2015-08-08 DIAGNOSIS — K219 Gastro-esophageal reflux disease without esophagitis: Secondary | ICD-10-CM | POA: Diagnosis not present

## 2015-08-08 DIAGNOSIS — E039 Hypothyroidism, unspecified: Secondary | ICD-10-CM | POA: Diagnosis not present

## 2015-08-08 DIAGNOSIS — Z0001 Encounter for general adult medical examination with abnormal findings: Secondary | ICD-10-CM | POA: Diagnosis not present

## 2015-08-08 DIAGNOSIS — E119 Type 2 diabetes mellitus without complications: Secondary | ICD-10-CM | POA: Diagnosis not present

## 2015-08-08 DIAGNOSIS — M1990 Unspecified osteoarthritis, unspecified site: Secondary | ICD-10-CM | POA: Diagnosis not present

## 2015-08-08 DIAGNOSIS — H3530 Unspecified macular degeneration: Secondary | ICD-10-CM | POA: Diagnosis not present

## 2015-08-08 DIAGNOSIS — I10 Essential (primary) hypertension: Secondary | ICD-10-CM | POA: Diagnosis not present

## 2015-10-20 DIAGNOSIS — R109 Unspecified abdominal pain: Secondary | ICD-10-CM | POA: Diagnosis not present

## 2015-10-20 DIAGNOSIS — R7309 Other abnormal glucose: Secondary | ICD-10-CM | POA: Diagnosis not present

## 2015-10-20 DIAGNOSIS — Z23 Encounter for immunization: Secondary | ICD-10-CM | POA: Diagnosis not present

## 2015-10-20 DIAGNOSIS — R350 Frequency of micturition: Secondary | ICD-10-CM | POA: Diagnosis not present

## 2015-10-20 DIAGNOSIS — M542 Cervicalgia: Secondary | ICD-10-CM | POA: Diagnosis not present

## 2016-02-08 DIAGNOSIS — R252 Cramp and spasm: Secondary | ICD-10-CM | POA: Diagnosis not present

## 2016-02-08 DIAGNOSIS — M79606 Pain in leg, unspecified: Secondary | ICD-10-CM | POA: Diagnosis not present

## 2016-02-08 DIAGNOSIS — E119 Type 2 diabetes mellitus without complications: Secondary | ICD-10-CM | POA: Diagnosis not present

## 2016-02-08 DIAGNOSIS — Z7984 Long term (current) use of oral hypoglycemic drugs: Secondary | ICD-10-CM | POA: Diagnosis not present

## 2016-02-27 DIAGNOSIS — I739 Peripheral vascular disease, unspecified: Secondary | ICD-10-CM | POA: Diagnosis not present

## 2016-02-27 DIAGNOSIS — R252 Cramp and spasm: Secondary | ICD-10-CM | POA: Diagnosis not present

## 2016-02-27 DIAGNOSIS — E7800 Pure hypercholesterolemia, unspecified: Secondary | ICD-10-CM | POA: Diagnosis not present

## 2016-02-27 DIAGNOSIS — I10 Essential (primary) hypertension: Secondary | ICD-10-CM | POA: Diagnosis not present

## 2016-03-14 DIAGNOSIS — I739 Peripheral vascular disease, unspecified: Secondary | ICD-10-CM | POA: Diagnosis not present

## 2016-03-21 DIAGNOSIS — I739 Peripheral vascular disease, unspecified: Secondary | ICD-10-CM | POA: Diagnosis not present

## 2016-03-21 DIAGNOSIS — E7800 Pure hypercholesterolemia, unspecified: Secondary | ICD-10-CM | POA: Diagnosis not present

## 2016-03-21 DIAGNOSIS — I10 Essential (primary) hypertension: Secondary | ICD-10-CM | POA: Diagnosis not present

## 2016-03-21 DIAGNOSIS — R252 Cramp and spasm: Secondary | ICD-10-CM | POA: Diagnosis not present

## 2016-04-02 DIAGNOSIS — I10 Essential (primary) hypertension: Secondary | ICD-10-CM | POA: Diagnosis not present

## 2016-04-02 DIAGNOSIS — I739 Peripheral vascular disease, unspecified: Secondary | ICD-10-CM | POA: Diagnosis not present

## 2016-04-02 DIAGNOSIS — E7800 Pure hypercholesterolemia, unspecified: Secondary | ICD-10-CM | POA: Diagnosis not present

## 2016-04-25 DIAGNOSIS — H8112 Benign paroxysmal vertigo, left ear: Secondary | ICD-10-CM | POA: Diagnosis not present

## 2016-04-25 DIAGNOSIS — H6122 Impacted cerumen, left ear: Secondary | ICD-10-CM | POA: Diagnosis not present

## 2016-04-27 DIAGNOSIS — M2560 Stiffness of unspecified joint, not elsewhere classified: Secondary | ICD-10-CM | POA: Diagnosis not present

## 2016-04-27 DIAGNOSIS — L259 Unspecified contact dermatitis, unspecified cause: Secondary | ICD-10-CM | POA: Diagnosis not present

## 2016-04-27 DIAGNOSIS — M546 Pain in thoracic spine: Secondary | ICD-10-CM | POA: Diagnosis not present

## 2016-04-27 DIAGNOSIS — M6249 Contracture of muscle, multiple sites: Secondary | ICD-10-CM | POA: Diagnosis not present

## 2016-04-27 DIAGNOSIS — R21 Rash and other nonspecific skin eruption: Secondary | ICD-10-CM | POA: Diagnosis not present

## 2016-04-27 DIAGNOSIS — E119 Type 2 diabetes mellitus without complications: Secondary | ICD-10-CM | POA: Diagnosis not present

## 2016-04-27 DIAGNOSIS — M545 Low back pain: Secondary | ICD-10-CM | POA: Diagnosis not present

## 2016-04-28 DIAGNOSIS — H8113 Benign paroxysmal vertigo, bilateral: Secondary | ICD-10-CM | POA: Diagnosis not present

## 2016-04-28 DIAGNOSIS — Z6838 Body mass index (BMI) 38.0-38.9, adult: Secondary | ICD-10-CM | POA: Diagnosis not present

## 2016-04-28 DIAGNOSIS — I739 Peripheral vascular disease, unspecified: Secondary | ICD-10-CM | POA: Diagnosis not present

## 2016-04-30 DIAGNOSIS — M545 Low back pain: Secondary | ICD-10-CM | POA: Diagnosis not present

## 2016-04-30 DIAGNOSIS — M6249 Contracture of muscle, multiple sites: Secondary | ICD-10-CM | POA: Diagnosis not present

## 2016-04-30 DIAGNOSIS — M2560 Stiffness of unspecified joint, not elsewhere classified: Secondary | ICD-10-CM | POA: Diagnosis not present

## 2016-04-30 DIAGNOSIS — M546 Pain in thoracic spine: Secondary | ICD-10-CM | POA: Diagnosis not present

## 2016-05-02 DIAGNOSIS — M2560 Stiffness of unspecified joint, not elsewhere classified: Secondary | ICD-10-CM | POA: Diagnosis not present

## 2016-05-02 DIAGNOSIS — M546 Pain in thoracic spine: Secondary | ICD-10-CM | POA: Diagnosis not present

## 2016-05-02 DIAGNOSIS — M6249 Contracture of muscle, multiple sites: Secondary | ICD-10-CM | POA: Diagnosis not present

## 2016-05-02 DIAGNOSIS — M545 Low back pain: Secondary | ICD-10-CM | POA: Diagnosis not present

## 2016-05-21 DIAGNOSIS — Z862 Personal history of diseases of the blood and blood-forming organs and certain disorders involving the immune mechanism: Secondary | ICD-10-CM | POA: Diagnosis not present

## 2016-05-21 DIAGNOSIS — Z7984 Long term (current) use of oral hypoglycemic drugs: Secondary | ICD-10-CM | POA: Diagnosis not present

## 2016-05-21 DIAGNOSIS — E119 Type 2 diabetes mellitus without complications: Secondary | ICD-10-CM | POA: Diagnosis not present

## 2016-05-21 DIAGNOSIS — R252 Cramp and spasm: Secondary | ICD-10-CM | POA: Diagnosis not present

## 2016-05-30 DIAGNOSIS — R197 Diarrhea, unspecified: Secondary | ICD-10-CM | POA: Diagnosis not present

## 2016-06-04 DIAGNOSIS — R197 Diarrhea, unspecified: Secondary | ICD-10-CM | POA: Diagnosis not present

## 2016-06-20 DIAGNOSIS — E7800 Pure hypercholesterolemia, unspecified: Secondary | ICD-10-CM | POA: Diagnosis not present

## 2016-06-20 DIAGNOSIS — I739 Peripheral vascular disease, unspecified: Secondary | ICD-10-CM | POA: Diagnosis not present

## 2016-06-20 DIAGNOSIS — I10 Essential (primary) hypertension: Secondary | ICD-10-CM | POA: Diagnosis not present

## 2016-06-20 DIAGNOSIS — R252 Cramp and spasm: Secondary | ICD-10-CM | POA: Diagnosis not present

## 2016-07-30 DIAGNOSIS — M542 Cervicalgia: Secondary | ICD-10-CM | POA: Diagnosis not present

## 2016-07-30 DIAGNOSIS — Z7984 Long term (current) use of oral hypoglycemic drugs: Secondary | ICD-10-CM | POA: Diagnosis not present

## 2016-07-30 DIAGNOSIS — E119 Type 2 diabetes mellitus without complications: Secondary | ICD-10-CM | POA: Diagnosis not present

## 2016-09-06 ENCOUNTER — Encounter: Payer: Self-pay | Admitting: Podiatry

## 2016-09-06 ENCOUNTER — Ambulatory Visit (INDEPENDENT_AMBULATORY_CARE_PROVIDER_SITE_OTHER): Payer: Medicare Other | Admitting: Podiatry

## 2016-09-06 VITALS — BP 131/54 | HR 74 | Resp 14

## 2016-09-06 DIAGNOSIS — Z23 Encounter for immunization: Secondary | ICD-10-CM | POA: Diagnosis not present

## 2016-09-06 DIAGNOSIS — E7800 Pure hypercholesterolemia, unspecified: Secondary | ICD-10-CM | POA: Diagnosis not present

## 2016-09-06 DIAGNOSIS — E039 Hypothyroidism, unspecified: Secondary | ICD-10-CM | POA: Diagnosis not present

## 2016-09-06 DIAGNOSIS — B351 Tinea unguium: Secondary | ICD-10-CM | POA: Diagnosis not present

## 2016-09-06 DIAGNOSIS — M79676 Pain in unspecified toe(s): Secondary | ICD-10-CM

## 2016-09-06 DIAGNOSIS — K219 Gastro-esophageal reflux disease without esophagitis: Secondary | ICD-10-CM | POA: Diagnosis not present

## 2016-09-06 DIAGNOSIS — I739 Peripheral vascular disease, unspecified: Secondary | ICD-10-CM | POA: Diagnosis not present

## 2016-09-06 DIAGNOSIS — E1159 Type 2 diabetes mellitus with other circulatory complications: Secondary | ICD-10-CM | POA: Diagnosis not present

## 2016-09-06 DIAGNOSIS — Z862 Personal history of diseases of the blood and blood-forming organs and certain disorders involving the immune mechanism: Secondary | ICD-10-CM | POA: Diagnosis not present

## 2016-09-06 DIAGNOSIS — Z0000 Encounter for general adult medical examination without abnormal findings: Secondary | ICD-10-CM | POA: Diagnosis not present

## 2016-09-06 DIAGNOSIS — I10 Essential (primary) hypertension: Secondary | ICD-10-CM | POA: Diagnosis not present

## 2016-09-06 NOTE — Addendum Note (Signed)
Addended byDeidre Ala, Muneer Leider L on: 09/06/2016 04:19 PM   Modules accepted: Orders

## 2016-09-06 NOTE — Progress Notes (Signed)
   Subjective:    Patient ID: Yolanda Castaneda, female    DOB: Aug 29, 1935, 81 y.o.   MRN: [id number]  HPI this patient presents the office with chief complaint of long thick painful ingrowing toenails. She states she has been going to a salon and well. He but she chose to come here due to skin dermatitis that developed following the previous 2 treatment. She says her nails are painful walking and wearing her shoes. She is a diabetic. She also states that she is under treatment for vascular problems on her right side. She presents the office today requesting evaluation and treatment of her long painful nails    Review of Systems  All other systems reviewed and are negative.      Objective:   Physical Exam GENERAL APPEARANCE: Alert, conversant. Appropriately groomed. No acute distress.  VASCULAR: Pedal pulses are barely  palpable at  [facility] and PT bilateral.  Capillary refill time is immediate to all digits,  Normal temperature gradient.   NEUROLOGIC: sensation is diminished to 5.07 monofilament at 5/5 sites bilateral.  Light touch is intact bilateral, Muscle strength normal.  MUSCULOSKELETAL: acceptable muscle strength, tone and stability bilateral.  Intrinsic muscluature intact bilateral.  Rectus appearance of foot .  Hammer toe second toe right foot.   DERMATOLOGIC: skin color, texture, and turgor are within normal limits.  No preulcerative lesions or ulcers  are seen, no interdigital maceration noted.  No open lesions present.   No drainage noted.  NAILS  Thick disfigured discolored nails both great toes.         Assessment & Plan:  Onychomycosis  Diabetes with angiopathy  IE  Debridement of nails  B/L  RTC 3 months.   Gardiner Barefoot DPM

## 2016-09-19 ENCOUNTER — Ambulatory Visit: Payer: Medicare Other | Admitting: Podiatry

## 2016-11-22 ENCOUNTER — Ambulatory Visit: Payer: Medicare Other | Admitting: Podiatry

## 2016-11-29 ENCOUNTER — Encounter: Payer: Self-pay | Admitting: Podiatry

## 2016-11-29 ENCOUNTER — Ambulatory Visit (INDEPENDENT_AMBULATORY_CARE_PROVIDER_SITE_OTHER): Payer: Medicare Other | Admitting: Podiatry

## 2016-11-29 VITALS — Resp 18 | Ht 61.0 in

## 2016-11-29 DIAGNOSIS — M79676 Pain in unspecified toe(s): Secondary | ICD-10-CM

## 2016-11-29 DIAGNOSIS — B351 Tinea unguium: Secondary | ICD-10-CM | POA: Diagnosis not present

## 2016-11-29 NOTE — Progress Notes (Signed)
   Subjective:    Patient ID: Yolanda Castaneda, female    DOB: [birth year], 81 y.o.   MRN: [id number]  HPI this patient presents the office with chief complaint of long thick painful ingrowing toenails. She states she has been going to a salon and well. He but she chose to come here due to skin dermatitis that developed following the previous 2 treatment. She says her nails are painful walking and wearing her shoes. She is a diabetic. She also states that she is under treatment for vascular problems on her right side. She presents the office today requesting evaluation and treatment of her long painful nails    Review of Systems  All other systems reviewed and are negative.      Objective:   Physical Exam GENERAL APPEARANCE: Alert, conversant. Appropriately groomed. No acute distress.  VASCULAR: Pedal pulses are barely  palpable at  [facility] and PT bilateral.  Capillary refill time is immediate to all digits,  Normal temperature gradient.   NEUROLOGIC: sensation is diminished to 5.07 monofilament at 5/5 sites bilateral.  Light touch is intact bilateral, Muscle strength normal.  MUSCULOSKELETAL: acceptable muscle strength, tone and stability bilateral.  Intrinsic muscluature intact bilateral.  Rectus appearance of foot .  Hammer toe second toe right foot.   DERMATOLOGIC: skin color, texture, and turgor are within normal limits.  No preulcerative lesions or ulcers  are seen, no interdigital maceration noted.  No open lesions present.   No drainage noted.  NAILS  Thick disfigured discolored nails both great toes.         Assessment & Plan:  Onychomycosis  Diabetes with angiopathy  IE  Debridement of nails  B/L  RTC 3 months.   Gardiner Barefoot DPM

## 2016-12-31 DIAGNOSIS — H9202 Otalgia, left ear: Secondary | ICD-10-CM | POA: Diagnosis not present

## 2016-12-31 DIAGNOSIS — Z6837 Body mass index (BMI) 37.0-37.9, adult: Secondary | ICD-10-CM | POA: Diagnosis not present

## 2017-01-02 DIAGNOSIS — E039 Hypothyroidism, unspecified: Secondary | ICD-10-CM | POA: Diagnosis not present

## 2017-01-02 DIAGNOSIS — E119 Type 2 diabetes mellitus without complications: Secondary | ICD-10-CM | POA: Diagnosis not present

## 2017-01-02 DIAGNOSIS — G459 Transient cerebral ischemic attack, unspecified: Secondary | ICD-10-CM | POA: Diagnosis not present

## 2017-01-02 DIAGNOSIS — R4182 Altered mental status, unspecified: Secondary | ICD-10-CM | POA: Diagnosis not present

## 2017-01-02 DIAGNOSIS — R404 Transient alteration of awareness: Secondary | ICD-10-CM | POA: Diagnosis not present

## 2017-01-02 DIAGNOSIS — G238 Other specified degenerative diseases of basal ganglia: Secondary | ICD-10-CM | POA: Diagnosis not present

## 2017-01-02 DIAGNOSIS — I444 Left anterior fascicular block: Secondary | ICD-10-CM | POA: Diagnosis not present

## 2017-01-02 DIAGNOSIS — I10 Essential (primary) hypertension: Secondary | ICD-10-CM | POA: Diagnosis not present

## 2017-01-02 DIAGNOSIS — Z8673 Personal history of transient ischemic attack (TIA), and cerebral infarction without residual deficits: Secondary | ICD-10-CM | POA: Diagnosis not present

## 2017-01-03 DIAGNOSIS — R4182 Altered mental status, unspecified: Secondary | ICD-10-CM | POA: Diagnosis not present

## 2017-01-03 DIAGNOSIS — R531 Weakness: Secondary | ICD-10-CM | POA: Diagnosis not present

## 2017-01-03 DIAGNOSIS — I517 Cardiomegaly: Secondary | ICD-10-CM | POA: Diagnosis not present

## 2017-01-03 DIAGNOSIS — R4789 Other speech disturbances: Secondary | ICD-10-CM | POA: Diagnosis not present

## 2017-01-03 DIAGNOSIS — G459 Transient cerebral ischemic attack, unspecified: Secondary | ICD-10-CM | POA: Diagnosis not present

## 2017-01-03 DIAGNOSIS — I639 Cerebral infarction, unspecified: Secondary | ICD-10-CM | POA: Diagnosis not present

## 2017-01-03 DIAGNOSIS — I6522 Occlusion and stenosis of left carotid artery: Secondary | ICD-10-CM | POA: Diagnosis not present

## 2017-01-03 DIAGNOSIS — I081 Rheumatic disorders of both mitral and tricuspid valves: Secondary | ICD-10-CM | POA: Diagnosis not present

## 2017-01-03 DIAGNOSIS — E119 Type 2 diabetes mellitus without complications: Secondary | ICD-10-CM | POA: Diagnosis not present

## 2017-01-03 DIAGNOSIS — R200 Anesthesia of skin: Secondary | ICD-10-CM | POA: Diagnosis not present

## 2017-01-03 DIAGNOSIS — I10 Essential (primary) hypertension: Secondary | ICD-10-CM | POA: Diagnosis not present

## 2017-01-07 DIAGNOSIS — G459 Transient cerebral ischemic attack, unspecified: Secondary | ICD-10-CM | POA: Diagnosis not present

## 2017-01-07 DIAGNOSIS — D329 Benign neoplasm of meninges, unspecified: Secondary | ICD-10-CM | POA: Diagnosis not present

## 2017-01-07 DIAGNOSIS — I739 Peripheral vascular disease, unspecified: Secondary | ICD-10-CM | POA: Diagnosis not present

## 2017-01-07 DIAGNOSIS — I10 Essential (primary) hypertension: Secondary | ICD-10-CM | POA: Diagnosis not present

## 2017-01-18 DIAGNOSIS — E1142 Type 2 diabetes mellitus with diabetic polyneuropathy: Secondary | ICD-10-CM | POA: Diagnosis not present

## 2017-01-22 DIAGNOSIS — R4789 Other speech disturbances: Secondary | ICD-10-CM | POA: Diagnosis not present

## 2017-01-22 DIAGNOSIS — Z7902 Long term (current) use of antithrombotics/antiplatelets: Secondary | ICD-10-CM | POA: Diagnosis not present

## 2017-01-22 DIAGNOSIS — I10 Essential (primary) hypertension: Secondary | ICD-10-CM | POA: Diagnosis not present

## 2017-01-22 DIAGNOSIS — G8191 Hemiplegia, unspecified affecting right dominant side: Secondary | ICD-10-CM | POA: Diagnosis not present

## 2017-01-22 DIAGNOSIS — R200 Anesthesia of skin: Secondary | ICD-10-CM | POA: Diagnosis not present

## 2017-01-22 DIAGNOSIS — Z7984 Long term (current) use of oral hypoglycemic drugs: Secondary | ICD-10-CM | POA: Diagnosis not present

## 2017-01-22 DIAGNOSIS — Z7982 Long term (current) use of aspirin: Secondary | ICD-10-CM | POA: Diagnosis not present

## 2017-01-22 DIAGNOSIS — E039 Hypothyroidism, unspecified: Secondary | ICD-10-CM | POA: Diagnosis not present

## 2017-01-22 DIAGNOSIS — R2689 Other abnormalities of gait and mobility: Secondary | ICD-10-CM | POA: Diagnosis not present

## 2017-01-22 DIAGNOSIS — I672 Cerebral atherosclerosis: Secondary | ICD-10-CM | POA: Diagnosis not present

## 2017-01-22 DIAGNOSIS — I7389 Other specified peripheral vascular diseases: Secondary | ICD-10-CM | POA: Diagnosis not present

## 2017-01-22 DIAGNOSIS — E119 Type 2 diabetes mellitus without complications: Secondary | ICD-10-CM | POA: Diagnosis not present

## 2017-01-22 DIAGNOSIS — I6789 Other cerebrovascular disease: Secondary | ICD-10-CM | POA: Diagnosis not present

## 2017-01-22 DIAGNOSIS — G459 Transient cerebral ischemic attack, unspecified: Secondary | ICD-10-CM | POA: Diagnosis not present

## 2017-01-22 DIAGNOSIS — R0989 Other specified symptoms and signs involving the circulatory and respiratory systems: Secondary | ICD-10-CM | POA: Diagnosis not present

## 2017-01-22 DIAGNOSIS — R202 Paresthesia of skin: Secondary | ICD-10-CM | POA: Diagnosis not present

## 2017-01-23 DIAGNOSIS — E039 Hypothyroidism, unspecified: Secondary | ICD-10-CM | POA: Diagnosis not present

## 2017-01-23 DIAGNOSIS — I639 Cerebral infarction, unspecified: Secondary | ICD-10-CM | POA: Diagnosis not present

## 2017-01-23 DIAGNOSIS — I10 Essential (primary) hypertension: Secondary | ICD-10-CM | POA: Diagnosis not present

## 2017-01-23 DIAGNOSIS — G459 Transient cerebral ischemic attack, unspecified: Secondary | ICD-10-CM | POA: Diagnosis not present

## 2017-01-23 DIAGNOSIS — I739 Peripheral vascular disease, unspecified: Secondary | ICD-10-CM | POA: Diagnosis not present

## 2017-01-23 DIAGNOSIS — R200 Anesthesia of skin: Secondary | ICD-10-CM | POA: Diagnosis not present

## 2017-01-23 DIAGNOSIS — I6523 Occlusion and stenosis of bilateral carotid arteries: Secondary | ICD-10-CM | POA: Diagnosis not present

## 2017-01-23 DIAGNOSIS — G8191 Hemiplegia, unspecified affecting right dominant side: Secondary | ICD-10-CM | POA: Diagnosis not present

## 2017-02-08 DIAGNOSIS — Z8673 Personal history of transient ischemic attack (TIA), and cerebral infarction without residual deficits: Secondary | ICD-10-CM | POA: Diagnosis not present

## 2017-02-08 DIAGNOSIS — M1712 Unilateral primary osteoarthritis, left knee: Secondary | ICD-10-CM | POA: Diagnosis not present

## 2017-02-08 DIAGNOSIS — E118 Type 2 diabetes mellitus with unspecified complications: Secondary | ICD-10-CM | POA: Diagnosis not present

## 2017-02-13 DIAGNOSIS — G459 Transient cerebral ischemic attack, unspecified: Secondary | ICD-10-CM | POA: Diagnosis not present

## 2017-02-13 DIAGNOSIS — G8191 Hemiplegia, unspecified affecting right dominant side: Secondary | ICD-10-CM | POA: Diagnosis not present

## 2017-02-21 DIAGNOSIS — E119 Type 2 diabetes mellitus without complications: Secondary | ICD-10-CM | POA: Diagnosis not present

## 2017-02-21 DIAGNOSIS — M79606 Pain in leg, unspecified: Secondary | ICD-10-CM | POA: Diagnosis not present

## 2017-03-01 DIAGNOSIS — H26493 Other secondary cataract, bilateral: Secondary | ICD-10-CM | POA: Diagnosis not present

## 2017-03-01 DIAGNOSIS — E119 Type 2 diabetes mellitus without complications: Secondary | ICD-10-CM | POA: Diagnosis not present

## 2017-03-01 DIAGNOSIS — H353131 Nonexudative age-related macular degeneration, bilateral, early dry stage: Secondary | ICD-10-CM | POA: Diagnosis not present

## 2017-03-01 DIAGNOSIS — Z7984 Long term (current) use of oral hypoglycemic drugs: Secondary | ICD-10-CM | POA: Diagnosis not present

## 2017-03-19 DIAGNOSIS — E1142 Type 2 diabetes mellitus with diabetic polyneuropathy: Secondary | ICD-10-CM | POA: Diagnosis not present

## 2017-03-22 DIAGNOSIS — I8312 Varicose veins of left lower extremity with inflammation: Secondary | ICD-10-CM | POA: Diagnosis not present

## 2017-03-22 DIAGNOSIS — I70213 Atherosclerosis of native arteries of extremities with intermittent claudication, bilateral legs: Secondary | ICD-10-CM | POA: Diagnosis not present

## 2017-03-26 DIAGNOSIS — Z6836 Body mass index (BMI) 36.0-36.9, adult: Secondary | ICD-10-CM | POA: Diagnosis not present

## 2017-03-26 DIAGNOSIS — I10 Essential (primary) hypertension: Secondary | ICD-10-CM | POA: Diagnosis not present

## 2017-03-26 DIAGNOSIS — E1151 Type 2 diabetes mellitus with diabetic peripheral angiopathy without gangrene: Secondary | ICD-10-CM | POA: Diagnosis not present

## 2017-04-15 DIAGNOSIS — I872 Venous insufficiency (chronic) (peripheral): Secondary | ICD-10-CM | POA: Diagnosis not present

## 2017-04-19 DIAGNOSIS — M79605 Pain in left leg: Secondary | ICD-10-CM | POA: Diagnosis not present

## 2017-04-19 DIAGNOSIS — M79604 Pain in right leg: Secondary | ICD-10-CM | POA: Diagnosis not present

## 2017-04-19 DIAGNOSIS — M79609 Pain in unspecified limb: Secondary | ICD-10-CM | POA: Diagnosis not present

## 2017-04-19 DIAGNOSIS — I739 Peripheral vascular disease, unspecified: Secondary | ICD-10-CM | POA: Diagnosis not present

## 2017-04-24 DIAGNOSIS — I70293 Other atherosclerosis of native arteries of extremities, bilateral legs: Secondary | ICD-10-CM | POA: Diagnosis not present

## 2017-04-24 DIAGNOSIS — Z01810 Encounter for preprocedural cardiovascular examination: Secondary | ICD-10-CM | POA: Diagnosis not present

## 2017-04-24 DIAGNOSIS — I70213 Atherosclerosis of native arteries of extremities with intermittent claudication, bilateral legs: Secondary | ICD-10-CM | POA: Diagnosis not present

## 2017-04-29 DIAGNOSIS — I70212 Atherosclerosis of native arteries of extremities with intermittent claudication, left leg: Secondary | ICD-10-CM | POA: Diagnosis not present

## 2017-04-29 DIAGNOSIS — I70291 Other atherosclerosis of native arteries of extremities, right leg: Secondary | ICD-10-CM | POA: Diagnosis not present

## 2017-04-29 DIAGNOSIS — E119 Type 2 diabetes mellitus without complications: Secondary | ICD-10-CM | POA: Diagnosis not present

## 2017-05-21 DIAGNOSIS — Q2732 Arteriovenous malformation of vessel of lower limb: Secondary | ICD-10-CM | POA: Diagnosis not present

## 2017-05-21 DIAGNOSIS — I70213 Atherosclerosis of native arteries of extremities with intermittent claudication, bilateral legs: Secondary | ICD-10-CM | POA: Diagnosis not present

## 2017-06-01 DIAGNOSIS — Z6837 Body mass index (BMI) 37.0-37.9, adult: Secondary | ICD-10-CM | POA: Diagnosis not present

## 2017-06-01 DIAGNOSIS — L235 Allergic contact dermatitis due to other chemical products: Secondary | ICD-10-CM | POA: Diagnosis not present

## 2017-06-07 DIAGNOSIS — I10 Essential (primary) hypertension: Secondary | ICD-10-CM | POA: Diagnosis not present

## 2017-06-07 DIAGNOSIS — R252 Cramp and spasm: Secondary | ICD-10-CM | POA: Diagnosis not present

## 2017-06-07 DIAGNOSIS — Z6835 Body mass index (BMI) 35.0-35.9, adult: Secondary | ICD-10-CM | POA: Diagnosis not present

## 2017-06-07 DIAGNOSIS — E118 Type 2 diabetes mellitus with unspecified complications: Secondary | ICD-10-CM | POA: Diagnosis not present

## 2017-06-12 DIAGNOSIS — R252 Cramp and spasm: Secondary | ICD-10-CM | POA: Diagnosis not present

## 2017-06-12 DIAGNOSIS — I739 Peripheral vascular disease, unspecified: Secondary | ICD-10-CM | POA: Diagnosis not present

## 2017-06-12 DIAGNOSIS — M545 Low back pain: Secondary | ICD-10-CM | POA: Diagnosis not present

## 2017-06-12 DIAGNOSIS — G8929 Other chronic pain: Secondary | ICD-10-CM | POA: Diagnosis not present

## 2017-06-12 DIAGNOSIS — M6249 Contracture of muscle, multiple sites: Secondary | ICD-10-CM | POA: Diagnosis not present

## 2017-06-12 DIAGNOSIS — M546 Pain in thoracic spine: Secondary | ICD-10-CM | POA: Diagnosis not present

## 2017-06-13 DIAGNOSIS — R252 Cramp and spasm: Secondary | ICD-10-CM | POA: Diagnosis not present

## 2017-06-13 DIAGNOSIS — M545 Low back pain: Secondary | ICD-10-CM | POA: Diagnosis not present

## 2017-06-13 DIAGNOSIS — M6249 Contracture of muscle, multiple sites: Secondary | ICD-10-CM | POA: Diagnosis not present

## 2017-06-13 DIAGNOSIS — M546 Pain in thoracic spine: Secondary | ICD-10-CM | POA: Diagnosis not present

## 2017-06-14 DIAGNOSIS — M545 Low back pain: Secondary | ICD-10-CM | POA: Diagnosis not present

## 2017-06-14 DIAGNOSIS — M546 Pain in thoracic spine: Secondary | ICD-10-CM | POA: Diagnosis not present

## 2017-06-14 DIAGNOSIS — R252 Cramp and spasm: Secondary | ICD-10-CM | POA: Diagnosis not present

## 2017-06-14 DIAGNOSIS — M6249 Contracture of muscle, multiple sites: Secondary | ICD-10-CM | POA: Diagnosis not present

## 2017-07-26 DIAGNOSIS — R252 Cramp and spasm: Secondary | ICD-10-CM | POA: Diagnosis not present

## 2017-07-26 DIAGNOSIS — M545 Low back pain: Secondary | ICD-10-CM | POA: Diagnosis not present

## 2017-07-26 DIAGNOSIS — M546 Pain in thoracic spine: Secondary | ICD-10-CM | POA: Diagnosis not present

## 2017-07-26 DIAGNOSIS — M6249 Contracture of muscle, multiple sites: Secondary | ICD-10-CM | POA: Diagnosis not present

## 2017-07-29 DIAGNOSIS — R252 Cramp and spasm: Secondary | ICD-10-CM | POA: Diagnosis not present

## 2017-07-29 DIAGNOSIS — M546 Pain in thoracic spine: Secondary | ICD-10-CM | POA: Diagnosis not present

## 2017-07-29 DIAGNOSIS — M6249 Contracture of muscle, multiple sites: Secondary | ICD-10-CM | POA: Diagnosis not present

## 2017-07-29 DIAGNOSIS — M545 Low back pain: Secondary | ICD-10-CM | POA: Diagnosis not present

## 2017-07-31 DIAGNOSIS — G8191 Hemiplegia, unspecified affecting right dominant side: Secondary | ICD-10-CM | POA: Diagnosis not present

## 2017-07-31 DIAGNOSIS — G459 Transient cerebral ischemic attack, unspecified: Secondary | ICD-10-CM | POA: Diagnosis not present

## 2017-08-01 DIAGNOSIS — M545 Low back pain: Secondary | ICD-10-CM | POA: Diagnosis not present

## 2017-08-01 DIAGNOSIS — R252 Cramp and spasm: Secondary | ICD-10-CM | POA: Diagnosis not present

## 2017-08-01 DIAGNOSIS — M6249 Contracture of muscle, multiple sites: Secondary | ICD-10-CM | POA: Diagnosis not present

## 2017-08-01 DIAGNOSIS — M546 Pain in thoracic spine: Secondary | ICD-10-CM | POA: Diagnosis not present

## 2017-08-02 DIAGNOSIS — E039 Hypothyroidism, unspecified: Secondary | ICD-10-CM | POA: Diagnosis not present

## 2017-08-02 DIAGNOSIS — I739 Peripheral vascular disease, unspecified: Secondary | ICD-10-CM | POA: Diagnosis not present

## 2017-08-02 DIAGNOSIS — Z6836 Body mass index (BMI) 36.0-36.9, adult: Secondary | ICD-10-CM | POA: Diagnosis not present

## 2017-08-20 DIAGNOSIS — I6789 Other cerebrovascular disease: Secondary | ICD-10-CM | POA: Diagnosis not present

## 2017-08-20 DIAGNOSIS — G459 Transient cerebral ischemic attack, unspecified: Secondary | ICD-10-CM | POA: Diagnosis not present

## 2017-08-20 DIAGNOSIS — Z7982 Long term (current) use of aspirin: Secondary | ICD-10-CM | POA: Diagnosis not present

## 2017-08-20 DIAGNOSIS — E039 Hypothyroidism, unspecified: Secondary | ICD-10-CM | POA: Diagnosis not present

## 2017-08-20 DIAGNOSIS — I10 Essential (primary) hypertension: Secondary | ICD-10-CM | POA: Diagnosis not present

## 2017-08-20 DIAGNOSIS — Z7902 Long term (current) use of antithrombotics/antiplatelets: Secondary | ICD-10-CM | POA: Diagnosis not present

## 2017-08-20 DIAGNOSIS — Z79899 Other long term (current) drug therapy: Secondary | ICD-10-CM | POA: Diagnosis not present

## 2017-08-20 DIAGNOSIS — Z7901 Long term (current) use of anticoagulants: Secondary | ICD-10-CM | POA: Diagnosis not present

## 2017-08-20 DIAGNOSIS — D320 Benign neoplasm of cerebral meninges: Secondary | ICD-10-CM | POA: Diagnosis not present

## 2017-08-20 DIAGNOSIS — I639 Cerebral infarction, unspecified: Secondary | ICD-10-CM | POA: Diagnosis not present

## 2017-08-20 DIAGNOSIS — I6523 Occlusion and stenosis of bilateral carotid arteries: Secondary | ICD-10-CM | POA: Diagnosis not present

## 2017-08-20 DIAGNOSIS — R200 Anesthesia of skin: Secondary | ICD-10-CM | POA: Diagnosis not present

## 2017-08-20 DIAGNOSIS — Z8673 Personal history of transient ischemic attack (TIA), and cerebral infarction without residual deficits: Secondary | ICD-10-CM | POA: Diagnosis not present

## 2017-08-20 DIAGNOSIS — E785 Hyperlipidemia, unspecified: Secondary | ICD-10-CM | POA: Diagnosis not present

## 2017-08-20 DIAGNOSIS — E1151 Type 2 diabetes mellitus with diabetic peripheral angiopathy without gangrene: Secondary | ICD-10-CM | POA: Diagnosis not present

## 2017-08-20 DIAGNOSIS — R202 Paresthesia of skin: Secondary | ICD-10-CM | POA: Diagnosis not present

## 2017-08-21 DIAGNOSIS — G459 Transient cerebral ischemic attack, unspecified: Secondary | ICD-10-CM | POA: Diagnosis not present

## 2017-08-21 DIAGNOSIS — R200 Anesthesia of skin: Secondary | ICD-10-CM | POA: Diagnosis not present

## 2017-08-21 DIAGNOSIS — I081 Rheumatic disorders of both mitral and tricuspid valves: Secondary | ICD-10-CM | POA: Diagnosis not present

## 2017-08-22 DIAGNOSIS — G459 Transient cerebral ischemic attack, unspecified: Secondary | ICD-10-CM | POA: Diagnosis not present

## 2017-08-27 DIAGNOSIS — I10 Essential (primary) hypertension: Secondary | ICD-10-CM | POA: Diagnosis not present

## 2017-08-27 DIAGNOSIS — M79609 Pain in unspecified limb: Secondary | ICD-10-CM | POA: Diagnosis not present

## 2017-08-27 DIAGNOSIS — G459 Transient cerebral ischemic attack, unspecified: Secondary | ICD-10-CM | POA: Diagnosis not present

## 2017-08-27 DIAGNOSIS — Z23 Encounter for immunization: Secondary | ICD-10-CM | POA: Diagnosis not present

## 2017-08-27 DIAGNOSIS — R202 Paresthesia of skin: Secondary | ICD-10-CM | POA: Diagnosis not present

## 2017-09-02 DIAGNOSIS — Z8673 Personal history of transient ischemic attack (TIA), and cerebral infarction without residual deficits: Secondary | ICD-10-CM | POA: Diagnosis not present

## 2017-09-02 DIAGNOSIS — G8191 Hemiplegia, unspecified affecting right dominant side: Secondary | ICD-10-CM | POA: Diagnosis not present

## 2017-09-02 DIAGNOSIS — R202 Paresthesia of skin: Secondary | ICD-10-CM | POA: Diagnosis not present

## 2017-09-02 DIAGNOSIS — Z7984 Long term (current) use of oral hypoglycemic drugs: Secondary | ICD-10-CM | POA: Diagnosis not present

## 2017-09-02 DIAGNOSIS — Z7902 Long term (current) use of antithrombotics/antiplatelets: Secondary | ICD-10-CM | POA: Diagnosis not present

## 2017-09-02 DIAGNOSIS — Z7982 Long term (current) use of aspirin: Secondary | ICD-10-CM | POA: Diagnosis not present

## 2017-09-02 DIAGNOSIS — E119 Type 2 diabetes mellitus without complications: Secondary | ICD-10-CM | POA: Diagnosis not present

## 2017-09-02 DIAGNOSIS — I10 Essential (primary) hypertension: Secondary | ICD-10-CM | POA: Diagnosis not present

## 2017-09-02 DIAGNOSIS — I6789 Other cerebrovascular disease: Secondary | ICD-10-CM | POA: Diagnosis not present

## 2017-09-05 DIAGNOSIS — G40109 Localization-related (focal) (partial) symptomatic epilepsy and epileptic syndromes with simple partial seizures, not intractable, without status epilepticus: Secondary | ICD-10-CM | POA: Diagnosis not present

## 2017-09-05 DIAGNOSIS — D320 Benign neoplasm of cerebral meninges: Secondary | ICD-10-CM | POA: Diagnosis not present

## 2017-09-05 DIAGNOSIS — G451 Carotid artery syndrome (hemispheric): Secondary | ICD-10-CM | POA: Diagnosis not present

## 2017-09-15 DIAGNOSIS — I739 Peripheral vascular disease, unspecified: Secondary | ICD-10-CM | POA: Diagnosis not present

## 2017-09-15 DIAGNOSIS — E039 Hypothyroidism, unspecified: Secondary | ICD-10-CM | POA: Diagnosis not present

## 2017-09-17 DIAGNOSIS — I881 Chronic lymphadenitis, except mesenteric: Secondary | ICD-10-CM | POA: Diagnosis not present

## 2017-09-19 DIAGNOSIS — I6529 Occlusion and stenosis of unspecified carotid artery: Secondary | ICD-10-CM | POA: Diagnosis not present

## 2017-09-19 DIAGNOSIS — I739 Peripheral vascular disease, unspecified: Secondary | ICD-10-CM | POA: Diagnosis not present

## 2017-09-20 DIAGNOSIS — R209 Unspecified disturbances of skin sensation: Secondary | ICD-10-CM | POA: Diagnosis not present

## 2017-09-20 DIAGNOSIS — E118 Type 2 diabetes mellitus with unspecified complications: Secondary | ICD-10-CM | POA: Diagnosis not present

## 2017-09-20 DIAGNOSIS — I739 Peripheral vascular disease, unspecified: Secondary | ICD-10-CM | POA: Diagnosis not present

## 2017-09-20 DIAGNOSIS — I10 Essential (primary) hypertension: Secondary | ICD-10-CM | POA: Diagnosis not present

## 2017-09-25 DIAGNOSIS — I70203 Unspecified atherosclerosis of native arteries of extremities, bilateral legs: Secondary | ICD-10-CM | POA: Diagnosis not present

## 2017-09-25 DIAGNOSIS — I739 Peripheral vascular disease, unspecified: Secondary | ICD-10-CM | POA: Diagnosis not present

## 2017-09-25 DIAGNOSIS — Z91040 Latex allergy status: Secondary | ICD-10-CM | POA: Diagnosis not present

## 2017-09-25 DIAGNOSIS — Z9049 Acquired absence of other specified parts of digestive tract: Secondary | ICD-10-CM | POA: Diagnosis not present

## 2017-09-25 DIAGNOSIS — I7092 Chronic total occlusion of artery of the extremities: Secondary | ICD-10-CM | POA: Diagnosis not present

## 2017-09-25 DIAGNOSIS — I770 Arteriovenous fistula, acquired: Secondary | ICD-10-CM | POA: Diagnosis not present

## 2017-09-25 DIAGNOSIS — I10 Essential (primary) hypertension: Secondary | ICD-10-CM | POA: Diagnosis not present

## 2017-09-25 DIAGNOSIS — E785 Hyperlipidemia, unspecified: Secondary | ICD-10-CM | POA: Diagnosis not present

## 2017-09-25 DIAGNOSIS — Z880 Allergy status to penicillin: Secondary | ICD-10-CM | POA: Diagnosis not present

## 2017-10-01 DIAGNOSIS — R209 Unspecified disturbances of skin sensation: Secondary | ICD-10-CM | POA: Diagnosis not present

## 2017-10-01 DIAGNOSIS — E1165 Type 2 diabetes mellitus with hyperglycemia: Secondary | ICD-10-CM | POA: Diagnosis not present

## 2017-10-01 DIAGNOSIS — Z6837 Body mass index (BMI) 37.0-37.9, adult: Secondary | ICD-10-CM | POA: Diagnosis not present

## 2017-10-03 DIAGNOSIS — I6529 Occlusion and stenosis of unspecified carotid artery: Secondary | ICD-10-CM | POA: Diagnosis not present

## 2017-10-03 DIAGNOSIS — I739 Peripheral vascular disease, unspecified: Secondary | ICD-10-CM | POA: Diagnosis not present

## 2017-10-09 DIAGNOSIS — G40109 Localization-related (focal) (partial) symptomatic epilepsy and epileptic syndromes with simple partial seizures, not intractable, without status epilepticus: Secondary | ICD-10-CM | POA: Diagnosis not present

## 2017-10-09 DIAGNOSIS — G451 Carotid artery syndrome (hemispheric): Secondary | ICD-10-CM | POA: Diagnosis not present

## 2017-10-09 DIAGNOSIS — D320 Benign neoplasm of cerebral meninges: Secondary | ICD-10-CM | POA: Diagnosis not present

## 2017-10-14 DIAGNOSIS — M9904 Segmental and somatic dysfunction of sacral region: Secondary | ICD-10-CM | POA: Diagnosis not present

## 2017-10-14 DIAGNOSIS — M5136 Other intervertebral disc degeneration, lumbar region: Secondary | ICD-10-CM | POA: Diagnosis not present

## 2017-10-14 DIAGNOSIS — M9902 Segmental and somatic dysfunction of thoracic region: Secondary | ICD-10-CM | POA: Diagnosis not present

## 2017-10-14 DIAGNOSIS — M9903 Segmental and somatic dysfunction of lumbar region: Secondary | ICD-10-CM | POA: Diagnosis not present

## 2017-10-16 DIAGNOSIS — M9904 Segmental and somatic dysfunction of sacral region: Secondary | ICD-10-CM | POA: Diagnosis not present

## 2017-10-16 DIAGNOSIS — I739 Peripheral vascular disease, unspecified: Secondary | ICD-10-CM | POA: Diagnosis not present

## 2017-10-16 DIAGNOSIS — M9903 Segmental and somatic dysfunction of lumbar region: Secondary | ICD-10-CM | POA: Diagnosis not present

## 2017-10-16 DIAGNOSIS — M9902 Segmental and somatic dysfunction of thoracic region: Secondary | ICD-10-CM | POA: Diagnosis not present

## 2017-10-16 DIAGNOSIS — E039 Hypothyroidism, unspecified: Secondary | ICD-10-CM | POA: Diagnosis not present

## 2017-10-16 DIAGNOSIS — M5136 Other intervertebral disc degeneration, lumbar region: Secondary | ICD-10-CM | POA: Diagnosis not present

## 2017-10-18 DIAGNOSIS — M9902 Segmental and somatic dysfunction of thoracic region: Secondary | ICD-10-CM | POA: Diagnosis not present

## 2017-10-18 DIAGNOSIS — M9904 Segmental and somatic dysfunction of sacral region: Secondary | ICD-10-CM | POA: Diagnosis not present

## 2017-10-18 DIAGNOSIS — M5136 Other intervertebral disc degeneration, lumbar region: Secondary | ICD-10-CM | POA: Diagnosis not present

## 2017-10-18 DIAGNOSIS — M9903 Segmental and somatic dysfunction of lumbar region: Secondary | ICD-10-CM | POA: Diagnosis not present

## 2017-10-24 DIAGNOSIS — M9903 Segmental and somatic dysfunction of lumbar region: Secondary | ICD-10-CM | POA: Diagnosis not present

## 2017-10-24 DIAGNOSIS — M5136 Other intervertebral disc degeneration, lumbar region: Secondary | ICD-10-CM | POA: Diagnosis not present

## 2017-10-24 DIAGNOSIS — M9902 Segmental and somatic dysfunction of thoracic region: Secondary | ICD-10-CM | POA: Diagnosis not present

## 2017-10-24 DIAGNOSIS — M9904 Segmental and somatic dysfunction of sacral region: Secondary | ICD-10-CM | POA: Diagnosis not present

## 2017-10-28 DIAGNOSIS — E1151 Type 2 diabetes mellitus with diabetic peripheral angiopathy without gangrene: Secondary | ICD-10-CM | POA: Diagnosis not present

## 2017-10-28 DIAGNOSIS — E1142 Type 2 diabetes mellitus with diabetic polyneuropathy: Secondary | ICD-10-CM | POA: Diagnosis not present

## 2017-10-28 DIAGNOSIS — I739 Peripheral vascular disease, unspecified: Secondary | ICD-10-CM | POA: Diagnosis not present

## 2017-10-28 DIAGNOSIS — E039 Hypothyroidism, unspecified: Secondary | ICD-10-CM | POA: Diagnosis not present

## 2017-10-28 DIAGNOSIS — I10 Essential (primary) hypertension: Secondary | ICD-10-CM | POA: Diagnosis not present

## 2017-11-01 DIAGNOSIS — I10 Essential (primary) hypertension: Secondary | ICD-10-CM | POA: Diagnosis not present

## 2017-11-01 DIAGNOSIS — R319 Hematuria, unspecified: Secondary | ICD-10-CM | POA: Diagnosis not present

## 2017-11-01 DIAGNOSIS — R209 Unspecified disturbances of skin sensation: Secondary | ICD-10-CM | POA: Diagnosis not present

## 2017-11-01 DIAGNOSIS — E1165 Type 2 diabetes mellitus with hyperglycemia: Secondary | ICD-10-CM | POA: Diagnosis not present

## 2017-11-15 DIAGNOSIS — E039 Hypothyroidism, unspecified: Secondary | ICD-10-CM | POA: Diagnosis not present

## 2017-11-15 DIAGNOSIS — I739 Peripheral vascular disease, unspecified: Secondary | ICD-10-CM | POA: Diagnosis not present

## 2017-11-22 DIAGNOSIS — M545 Low back pain: Secondary | ICD-10-CM | POA: Diagnosis not present

## 2017-11-22 DIAGNOSIS — M6249 Contracture of muscle, multiple sites: Secondary | ICD-10-CM | POA: Diagnosis not present

## 2017-11-22 DIAGNOSIS — M2560 Stiffness of unspecified joint, not elsewhere classified: Secondary | ICD-10-CM | POA: Diagnosis not present

## 2017-11-22 DIAGNOSIS — M546 Pain in thoracic spine: Secondary | ICD-10-CM | POA: Diagnosis not present

## 2017-11-24 DIAGNOSIS — Z8673 Personal history of transient ischemic attack (TIA), and cerebral infarction without residual deficits: Secondary | ICD-10-CM | POA: Diagnosis not present

## 2017-11-24 DIAGNOSIS — Z79899 Other long term (current) drug therapy: Secondary | ICD-10-CM | POA: Diagnosis not present

## 2017-11-24 DIAGNOSIS — Z7902 Long term (current) use of antithrombotics/antiplatelets: Secondary | ICD-10-CM | POA: Diagnosis not present

## 2017-11-24 DIAGNOSIS — E785 Hyperlipidemia, unspecified: Secondary | ICD-10-CM | POA: Diagnosis not present

## 2017-11-24 DIAGNOSIS — R200 Anesthesia of skin: Secondary | ICD-10-CM | POA: Diagnosis not present

## 2017-11-24 DIAGNOSIS — I6381 Other cerebral infarction due to occlusion or stenosis of small artery: Secondary | ICD-10-CM | POA: Diagnosis not present

## 2017-11-24 DIAGNOSIS — E039 Hypothyroidism, unspecified: Secondary | ICD-10-CM | POA: Diagnosis not present

## 2017-11-24 DIAGNOSIS — Z91040 Latex allergy status: Secondary | ICD-10-CM | POA: Diagnosis not present

## 2017-11-24 DIAGNOSIS — I10 Essential (primary) hypertension: Secondary | ICD-10-CM | POA: Diagnosis not present

## 2017-11-24 DIAGNOSIS — Z794 Long term (current) use of insulin: Secondary | ICD-10-CM | POA: Diagnosis not present

## 2017-11-24 DIAGNOSIS — R202 Paresthesia of skin: Secondary | ICD-10-CM | POA: Diagnosis not present

## 2017-11-24 DIAGNOSIS — E119 Type 2 diabetes mellitus without complications: Secondary | ICD-10-CM | POA: Diagnosis not present

## 2017-11-24 DIAGNOSIS — R29818 Other symptoms and signs involving the nervous system: Secondary | ICD-10-CM | POA: Diagnosis not present

## 2017-11-24 DIAGNOSIS — Z7982 Long term (current) use of aspirin: Secondary | ICD-10-CM | POA: Diagnosis not present

## 2017-11-25 DIAGNOSIS — G459 Transient cerebral ischemic attack, unspecified: Secondary | ICD-10-CM | POA: Diagnosis not present

## 2017-11-26 DIAGNOSIS — E782 Mixed hyperlipidemia: Secondary | ICD-10-CM | POA: Diagnosis not present

## 2017-11-26 DIAGNOSIS — E119 Type 2 diabetes mellitus without complications: Secondary | ICD-10-CM | POA: Diagnosis not present

## 2017-11-26 DIAGNOSIS — R209 Unspecified disturbances of skin sensation: Secondary | ICD-10-CM | POA: Diagnosis not present

## 2017-11-26 DIAGNOSIS — I6381 Other cerebral infarction due to occlusion or stenosis of small artery: Secondary | ICD-10-CM | POA: Diagnosis not present

## 2017-11-28 DIAGNOSIS — M546 Pain in thoracic spine: Secondary | ICD-10-CM | POA: Diagnosis not present

## 2017-11-28 DIAGNOSIS — M6249 Contracture of muscle, multiple sites: Secondary | ICD-10-CM | POA: Diagnosis not present

## 2017-11-28 DIAGNOSIS — M2560 Stiffness of unspecified joint, not elsewhere classified: Secondary | ICD-10-CM | POA: Diagnosis not present

## 2017-11-28 DIAGNOSIS — M545 Low back pain: Secondary | ICD-10-CM | POA: Diagnosis not present

## 2017-11-29 ENCOUNTER — Telehealth: Payer: Self-pay | Admitting: *Deleted

## 2017-11-29 NOTE — Telephone Encounter (Signed)
Oncology Nurse Navigator Documentation  Oncology Nurse Navigator Flowsheets 11/29/2017  Navigator Location [facility]  Referral date to RadOnc/MedOnc 11/28/2017  Navigator Encounter Type Telephone/I received a referral on Yolanda Castaneda yesterday. I called to scheduled her to be seen but was unable to reach her.  I left vm message for her to call with my name and phone number.   Treatment Phase Abnormal Scans  Barriers/Navigation Needs Coordination of Care  Interventions Coordination of Care  Coordination of Care Appts  Acuity Level 1  Time Spent with Patient 15

## 2017-12-01 DIAGNOSIS — R531 Weakness: Secondary | ICD-10-CM | POA: Diagnosis not present

## 2017-12-01 DIAGNOSIS — R29818 Other symptoms and signs involving the nervous system: Secondary | ICD-10-CM | POA: Diagnosis not present

## 2017-12-01 DIAGNOSIS — E119 Type 2 diabetes mellitus without complications: Secondary | ICD-10-CM | POA: Diagnosis not present

## 2017-12-01 DIAGNOSIS — Z8673 Personal history of transient ischemic attack (TIA), and cerebral infarction without residual deficits: Secondary | ICD-10-CM | POA: Diagnosis not present

## 2017-12-01 DIAGNOSIS — Z794 Long term (current) use of insulin: Secondary | ICD-10-CM | POA: Diagnosis not present

## 2017-12-01 DIAGNOSIS — I6381 Other cerebral infarction due to occlusion or stenosis of small artery: Secondary | ICD-10-CM | POA: Diagnosis present

## 2017-12-01 DIAGNOSIS — Z86011 Personal history of benign neoplasm of the brain: Secondary | ICD-10-CM | POA: Diagnosis not present

## 2017-12-01 DIAGNOSIS — I639 Cerebral infarction, unspecified: Secondary | ICD-10-CM | POA: Diagnosis not present

## 2017-12-01 DIAGNOSIS — I10 Essential (primary) hypertension: Secondary | ICD-10-CM | POA: Diagnosis present

## 2017-12-01 DIAGNOSIS — I340 Nonrheumatic mitral (valve) insufficiency: Secondary | ICD-10-CM | POA: Diagnosis present

## 2017-12-01 DIAGNOSIS — Z7982 Long term (current) use of aspirin: Secondary | ICD-10-CM | POA: Diagnosis not present

## 2017-12-01 DIAGNOSIS — E1151 Type 2 diabetes mellitus with diabetic peripheral angiopathy without gangrene: Secondary | ICD-10-CM | POA: Diagnosis present

## 2017-12-01 DIAGNOSIS — E039 Hypothyroidism, unspecified: Secondary | ICD-10-CM | POA: Diagnosis present

## 2017-12-01 DIAGNOSIS — D329 Benign neoplasm of meninges, unspecified: Secondary | ICD-10-CM | POA: Diagnosis present

## 2017-12-01 DIAGNOSIS — I6389 Other cerebral infarction: Secondary | ICD-10-CM | POA: Diagnosis not present

## 2017-12-01 DIAGNOSIS — Z7902 Long term (current) use of antithrombotics/antiplatelets: Secondary | ICD-10-CM | POA: Diagnosis not present

## 2017-12-01 DIAGNOSIS — E785 Hyperlipidemia, unspecified: Secondary | ICD-10-CM | POA: Diagnosis present

## 2017-12-01 DIAGNOSIS — I480 Paroxysmal atrial fibrillation: Secondary | ICD-10-CM | POA: Diagnosis present

## 2017-12-01 DIAGNOSIS — G8929 Other chronic pain: Secondary | ICD-10-CM | POA: Diagnosis present

## 2017-12-01 DIAGNOSIS — R2681 Unsteadiness on feet: Secondary | ICD-10-CM | POA: Diagnosis present

## 2017-12-01 DIAGNOSIS — E7800 Pure hypercholesterolemia, unspecified: Secondary | ICD-10-CM | POA: Diagnosis present

## 2017-12-01 DIAGNOSIS — Z7901 Long term (current) use of anticoagulants: Secondary | ICD-10-CM | POA: Diagnosis not present

## 2017-12-02 ENCOUNTER — Encounter: Payer: Self-pay | Admitting: *Deleted

## 2017-12-02 ENCOUNTER — Telehealth: Payer: Self-pay | Admitting: *Deleted

## 2017-12-02 NOTE — Telephone Encounter (Signed)
Oncology Nurse Navigator Documentation  Oncology Nurse Navigator Flowsheets 12/02/2017  Navigator Location [facility]  Navigator Encounter Type Telephone/recieved referral from Dr. Chase Caller by mistake. I will cancel.   Telephone Outgoing Call  Acuity Level 1  Time Spent with Patient 15

## 2017-12-03 DIAGNOSIS — I639 Cerebral infarction, unspecified: Secondary | ICD-10-CM | POA: Diagnosis not present

## 2017-12-05 DIAGNOSIS — I480 Paroxysmal atrial fibrillation: Secondary | ICD-10-CM | POA: Diagnosis not present

## 2017-12-16 DIAGNOSIS — E039 Hypothyroidism, unspecified: Secondary | ICD-10-CM | POA: Diagnosis not present

## 2017-12-16 DIAGNOSIS — I739 Peripheral vascular disease, unspecified: Secondary | ICD-10-CM | POA: Diagnosis not present
